# Patient Record
Sex: Female | Born: 1980 | Race: White | Hispanic: No | Marital: Married | Smoking: Never smoker
Health system: Southern US, Community
[De-identification: ages and names within clinical notes are randomized; demographics above are authoritative.]

## PROBLEM LIST (undated history)

## (undated) DIAGNOSIS — K829 Disease of gallbladder, unspecified: Secondary | ICD-10-CM

## (undated) DIAGNOSIS — K649 Unspecified hemorrhoids: Secondary | ICD-10-CM

## (undated) DIAGNOSIS — Z973 Presence of spectacles and contact lenses: Secondary | ICD-10-CM

## (undated) DIAGNOSIS — E538 Deficiency of other specified B group vitamins: Secondary | ICD-10-CM

## (undated) DIAGNOSIS — E559 Vitamin D deficiency, unspecified: Secondary | ICD-10-CM

## (undated) DIAGNOSIS — E7800 Pure hypercholesterolemia, unspecified: Secondary | ICD-10-CM

## (undated) DIAGNOSIS — K644 Residual hemorrhoidal skin tags: Secondary | ICD-10-CM

## (undated) DIAGNOSIS — F419 Anxiety disorder, unspecified: Secondary | ICD-10-CM

## (undated) DIAGNOSIS — Z862 Personal history of diseases of the blood and blood-forming organs and certain disorders involving the immune mechanism: Secondary | ICD-10-CM

## (undated) HISTORY — DX: Deficiency of other specified B group vitamins: E53.8

## (undated) HISTORY — DX: Disease of gallbladder, unspecified: K82.9

## (undated) HISTORY — DX: Anxiety disorder, unspecified: F41.9

## (undated) HISTORY — DX: Vitamin D deficiency, unspecified: E55.9

## (undated) HISTORY — PX: WISDOM TOOTH EXTRACTION: SHX21

## (undated) HISTORY — DX: Pure hypercholesterolemia, unspecified: E78.00

---

## 2018-09-01 HISTORY — PX: COLONOSCOPY: SHX174

## 2019-12-21 ENCOUNTER — Other Ambulatory Visit: Payer: Self-pay | Admitting: Obstetrics & Gynecology

## 2019-12-21 DIAGNOSIS — Z9189 Other specified personal risk factors, not elsewhere classified: Secondary | ICD-10-CM

## 2020-05-11 ENCOUNTER — Ambulatory Visit (HOSPITAL_COMMUNITY)
Admission: EM | Admit: 2020-05-11 | Discharge: 2020-05-11 | Disposition: A | Discharge status: Home / Self Care | Payer: Managed Care, Other (non HMO) | Attending: Urgent Care | Admitting: Urgent Care

## 2020-05-11 ENCOUNTER — Other Ambulatory Visit: Payer: Self-pay

## 2020-05-11 ENCOUNTER — Encounter (HOSPITAL_COMMUNITY): Payer: Self-pay

## 2020-05-11 DIAGNOSIS — L299 Pruritus, unspecified: Secondary | ICD-10-CM | POA: Diagnosis present

## 2020-05-11 DIAGNOSIS — L709 Acne, unspecified: Secondary | ICD-10-CM | POA: Insufficient documentation

## 2020-05-11 DIAGNOSIS — L509 Urticaria, unspecified: Secondary | ICD-10-CM | POA: Diagnosis present

## 2020-05-11 LAB — CBC WITH DIFFERENTIAL/PLATELET
Abs Immature Granulocytes: 0.01 10*3/uL (ref 0.00–0.07)
Basophils Absolute: 0 10*3/uL (ref 0.0–0.1)
Basophils Relative: 1 %
Eosinophils Absolute: 0.1 10*3/uL (ref 0.0–0.5)
Eosinophils Relative: 3 %
HCT: 40.3 % (ref 36.0–46.0)
Hemoglobin: 13.1 g/dL (ref 12.0–15.0)
Immature Granulocytes: 0 %
Lymphocytes Relative: 25 %
Lymphs Abs: 0.9 10*3/uL (ref 0.7–4.0)
MCH: 29.2 pg (ref 26.0–34.0)
MCHC: 32.5 g/dL (ref 30.0–36.0)
MCV: 89.8 fL (ref 80.0–100.0)
Monocytes Absolute: 0.3 10*3/uL (ref 0.1–1.0)
Monocytes Relative: 8 %
Neutro Abs: 2.4 10*3/uL (ref 1.7–7.7)
Neutrophils Relative %: 63 %
Platelets: 334 10*3/uL (ref 150–400)
RBC: 4.49 MIL/uL (ref 3.87–5.11)
RDW: 13.8 % (ref 11.5–15.5)
WBC: 3.7 10*3/uL — ABNORMAL LOW (ref 4.0–10.5)
nRBC: 0 % (ref 0.0–0.2)

## 2020-05-11 LAB — COMPREHENSIVE METABOLIC PANEL
ALT: 547 U/L — ABNORMAL HIGH (ref 0–44)
AST: 193 U/L — ABNORMAL HIGH (ref 15–41)
Albumin: 3.7 g/dL (ref 3.5–5.0)
Alkaline Phosphatase: 170 U/L — ABNORMAL HIGH (ref 38–126)
Anion gap: 12 (ref 5–15)
BUN: 9 mg/dL (ref 6–20)
CO2: 21 mmol/L — ABNORMAL LOW (ref 22–32)
Calcium: 9.6 mg/dL (ref 8.9–10.3)
Chloride: 104 mmol/L (ref 98–111)
Creatinine, Ser: 0.81 mg/dL (ref 0.44–1.00)
GFR calc Af Amer: >60 mL/min (ref >60)
GFR calc non Af Amer: >60 mL/min (ref >60)
Glucose, Bld: 109 mg/dL — ABNORMAL HIGH (ref 70–99)
Potassium: 4.5 mmol/L (ref 3.5–5.1)
Sodium: 137 mmol/L (ref 135–145)
Total Bilirubin: 3.1 mg/dL — ABNORMAL HIGH (ref 0.3–1.2)
Total Protein: 7.5 g/dL (ref 6.5–8.1)

## 2020-05-11 MED ORDER — HYDROXYZINE HCL 25 MG PO TABS: 12.5000 mg | ORAL_TABLET | Freq: Three times a day (TID) | ORAL | 0 refills | Status: DC | PRN

## 2020-05-11 NOTE — ED Triage Notes (Addendum)
Patient presents to Urgent Care with complaints of generalized itching and bright yellow urine since 4 days ago, worse yesterday. Patient reports she has had her gallbladder removed and thinks there might be a connection.  Pt took an antihistamine last night (generic claritin) but no improvement. Pt received call from hematology during triage- blood work was completed yesterday and results showed elevated ALT (464) and AST (176), also Bilirubin was 1.8. Hematologist suspicious of liver abnormalities, no CBC done yesterday, suggesting recheck of blood work to include CBC and full liver panel.

## 2020-05-11 NOTE — ED Provider Notes (Signed)
[facility]   MRN: [id number] DOB: 02/27/1981  Subjective:   Breanna Dunn is a 39 y.o. female presenting for 4 day hx generalized itching, erythema patches over different places.  Patient states that she was seen by her dermatologist recently and was about to start Accutane when they drew labs as a screen.  Turned out she had elevated LFTs and a bilirubin of 1.8.  Patient's mother has significant history of malignancy.  She has actually undergone multiple screening tests including a colonoscopy and everything is come back negative up to now.  Denies fever, headache, sore throat, cough, chest pain, shortness of breath, vomiting, belly pain, dysuria, hematuria, clay colored stools.  Patient does not handle blood-borne products, is not an IV drug user, does not drink alcohol.  She is not a smoker.  Denies personal history of cancer. Denies eating any new foods, starting new medications, exposure to poisonous plants, new hygiene products, new cleaning products or detergents.   No current facility-administered medications for this encounter.  Current Outpatient Medications:  .  isotretinoin (ACCUTANE) 10 MG capsule, Take 10 mg by mouth 2 (two) times daily., Disp: , Rfl:    No Known Allergies  History reviewed. No pertinent past medical history.   History reviewed. No pertinent surgical history.  Family History  Problem Relation Age of Onset  . Cancer Mother        ovarian  . Multiple sclerosis Mother   . Healthy Father     Social History   Tobacco Use  . Smoking status: Never Smoker  . Smokeless tobacco: Never Used  Vaping Use  . Vaping Use: Never used  Substance Use Topics  . Alcohol use: Yes    Comment: weekly/ socially  . Drug use: Never    ROS   Objective:   Vitals: BP (!) 150/94 (BP Location: Right Arm)   Pulse 89   Temp 98.1 F (36.7 C) (Oral)   Resp 16   SpO2 100%   Physical Exam Constitutional:      General: She is not in acute  distress.    Appearance: Normal appearance. She is well-developed. She is not ill-appearing, toxic-appearing or diaphoretic.  HENT:     Head: Normocephalic and atraumatic.     Right Ear: External ear normal.     Left Ear: External ear normal.     Nose: Nose normal.     Mouth/Throat:     Mouth: Mucous membranes are moist.     Pharynx: Oropharynx is clear. No oropharyngeal exudate or posterior oropharyngeal erythema.  Eyes:     General: No scleral icterus.       Right eye: No discharge.        Left eye: No discharge.     Extraocular Movements: Extraocular movements intact.     Conjunctiva/sclera: Conjunctivae normal.     Pupils: Pupils are equal, round, and reactive to light.  Cardiovascular:     Rate and Rhythm: Normal rate and regular rhythm.     Pulses: Normal pulses.     Heart sounds: Normal heart sounds. No murmur heard.  No friction rub. No gallop.   Pulmonary:     Effort: Pulmonary effort is normal. No respiratory distress.     Breath sounds: Normal breath sounds. No stridor. No wheezing, rhonchi or rales.  Skin:    General: Skin is warm and dry.     Findings: Rash (urticarial lesions over back, chest; erythematous patches over patches) present.  Neurological:  Mental Status: She is alert and oriented to person, place, and time.  Psychiatric:        Mood and Affect: Mood normal.        Behavior: Behavior normal.        Thought Content: Thought content normal.        Judgment: Judgment normal.     Assessment and Plan :   PDMP not reviewed this encounter.  1. Itching   2. Urticaria   3. Acne, unspecified acne type    Labs pending, recommended hydroxyzine for itching and urticaria.  Also emphasized that she needs to keep a diary of any new foods or medicines that she is exposing herself to.  Recommend establishing care with new PCP, information provided to the patient. Counseled patient on potential for adverse effects with medications prescribed/recommended today,  ER and return-to-clinic precautions discussed, patient verbalized understanding.    Wallis Bamberg, PA-C 05/11/20 1740

## 2020-05-21 ENCOUNTER — Other Ambulatory Visit: Payer: Self-pay | Admitting: Gastroenterology

## 2020-05-21 DIAGNOSIS — R7989 Other specified abnormal findings of blood chemistry: Secondary | ICD-10-CM

## 2020-05-28 ENCOUNTER — Other Ambulatory Visit: Payer: Managed Care, Other (non HMO)

## 2020-06-07 ENCOUNTER — Ambulatory Visit
Admission: RE | Admit: 2020-06-07 | Discharge: 2020-06-07 | Disposition: A | Discharge status: Home / Self Care | Payer: Managed Care, Other (non HMO) | Source: Ambulatory Visit | Attending: Gastroenterology | Admitting: Gastroenterology

## 2020-06-07 DIAGNOSIS — R7989 Other specified abnormal findings of blood chemistry: Secondary | ICD-10-CM

## 2020-06-07 IMAGING — US US ABDOMEN LIMITED
1 series · 14 of 25 positions shown · non-contrast
Comparison: None

CLINICAL DATA: Elevated LFTs

EXAM:
ULTRASOUND ABDOMEN LIMITED RIGHT UPPER QUADRANT

[Series 1: us abdomen limited · 0.23mm/px · 14 of 36 slices shown]
[im 1/36]
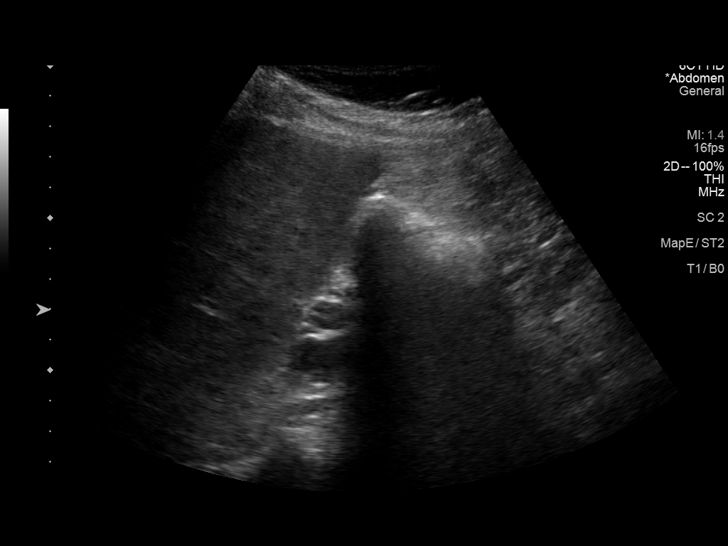
[im 3/36]
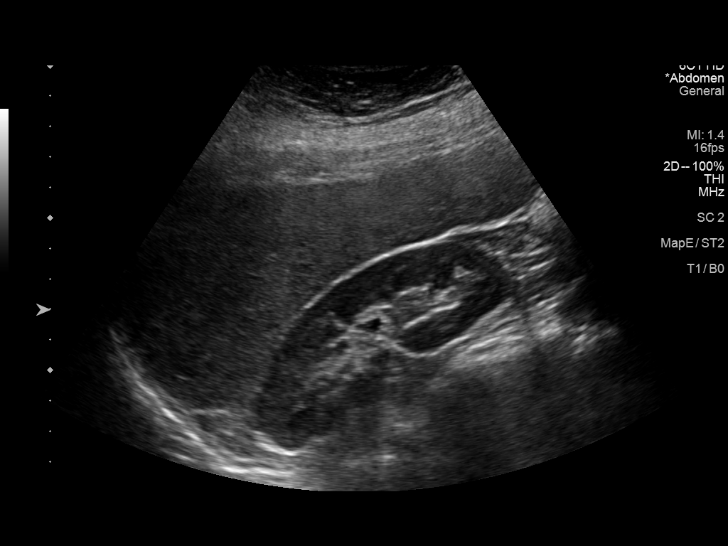
[im 6/36]
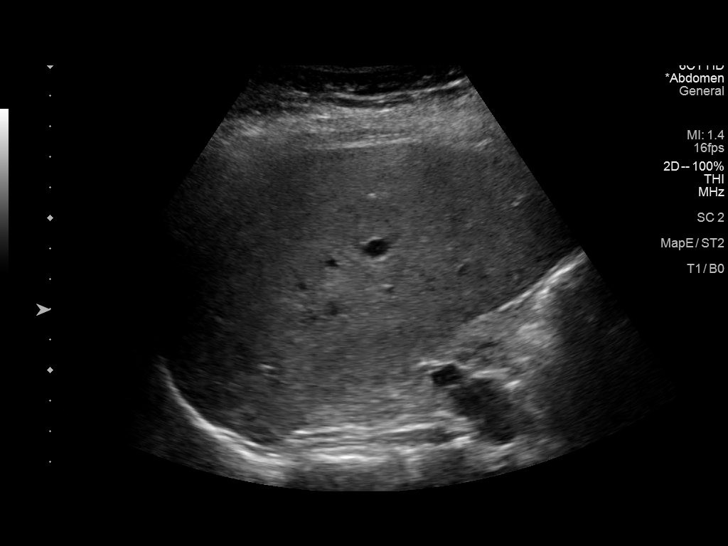
[im 9/36]
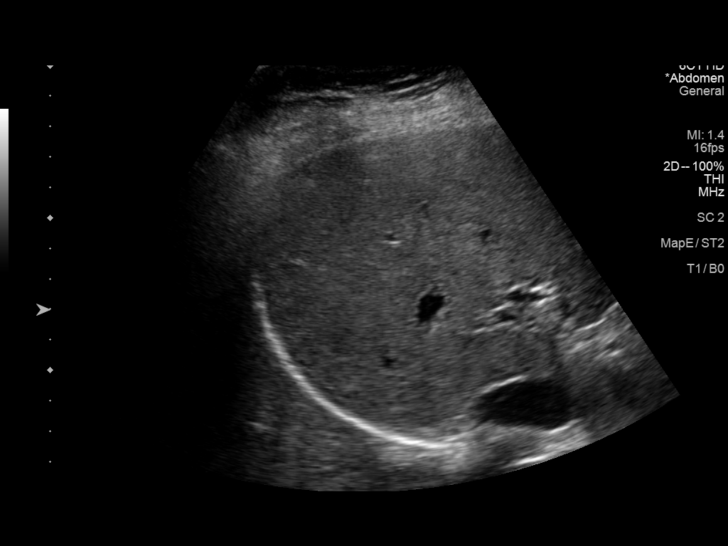
[im 12/36]
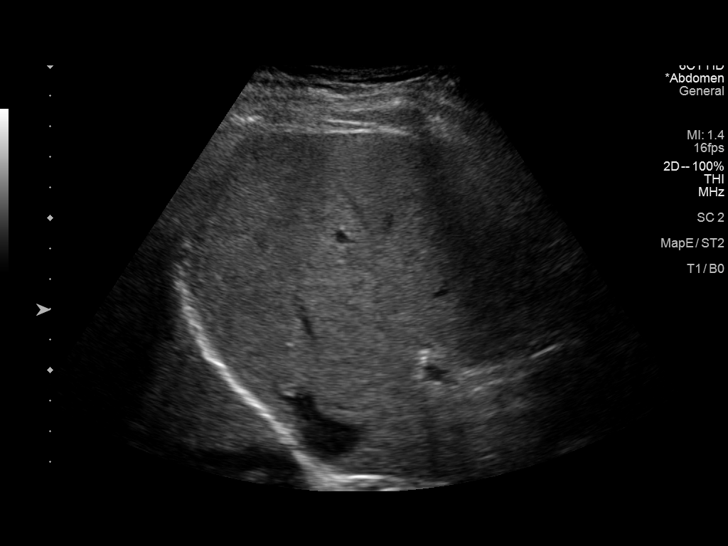
[im 14/36]
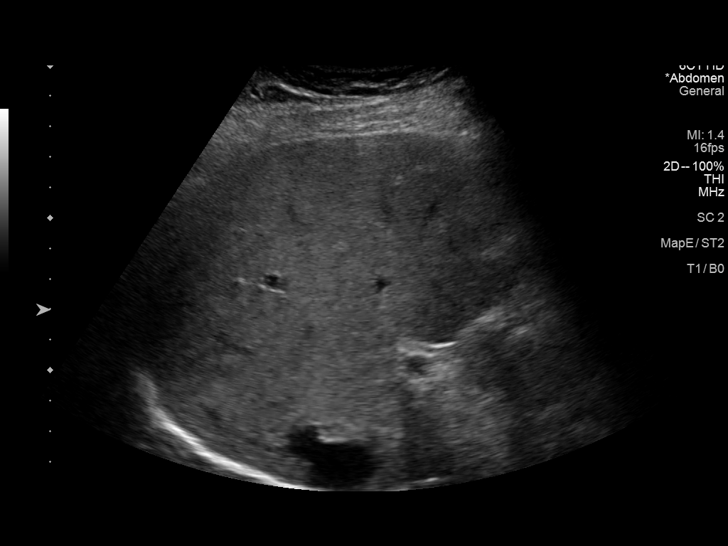
[im 17/36]
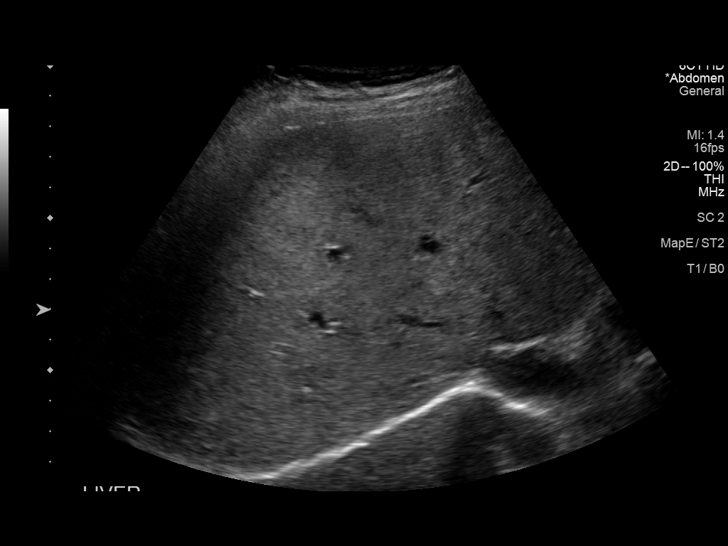
[im 19/36]
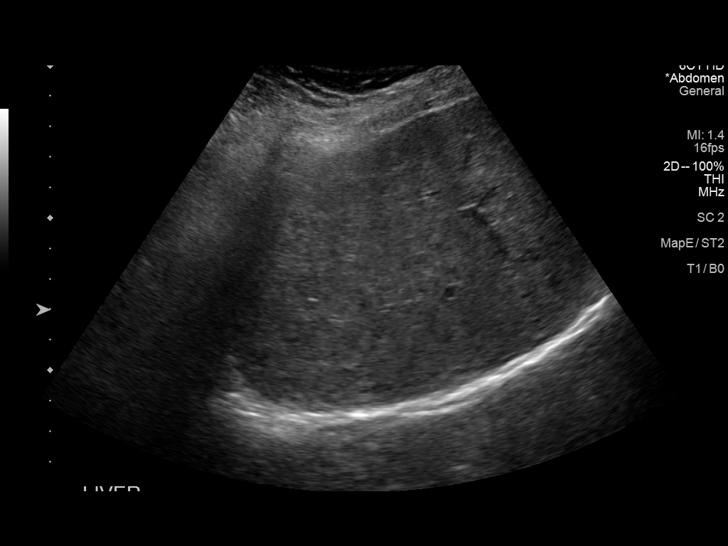
[im 22/36]
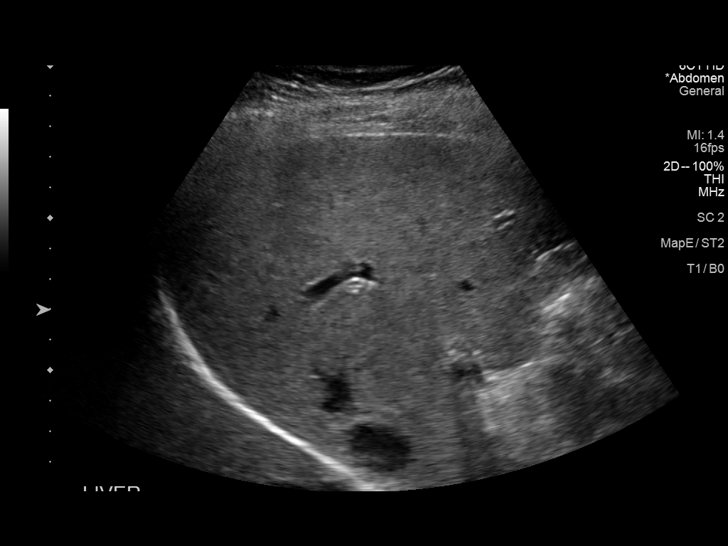
[im 24/36]
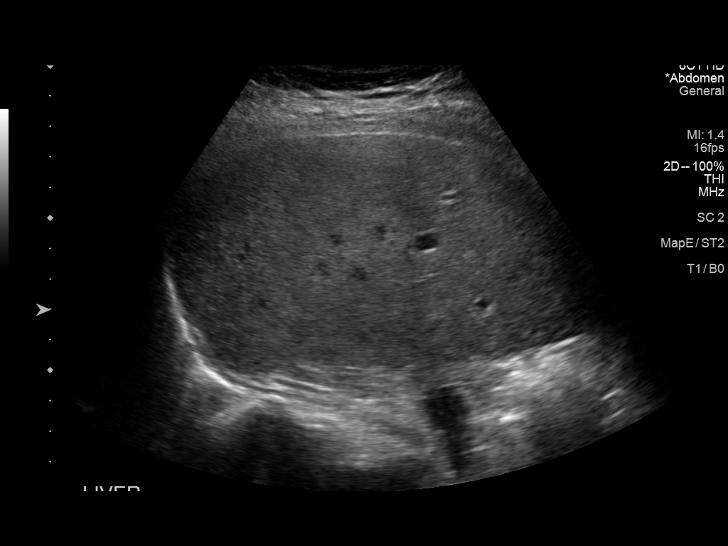
[im 27/36]
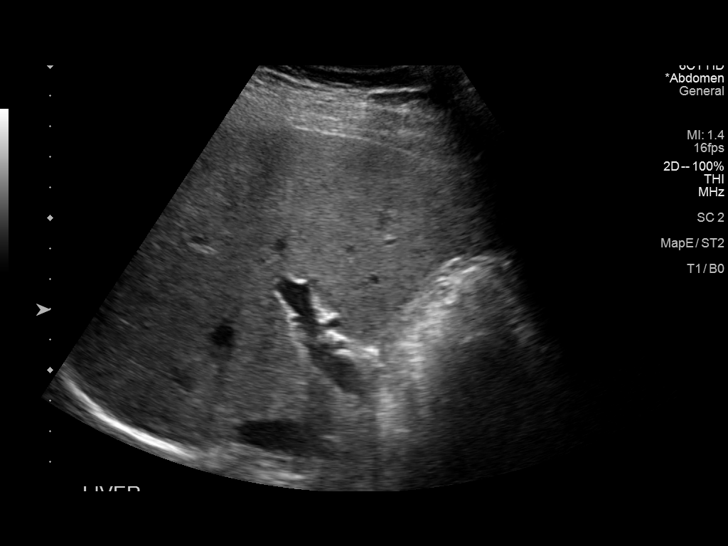
[im 30/36]
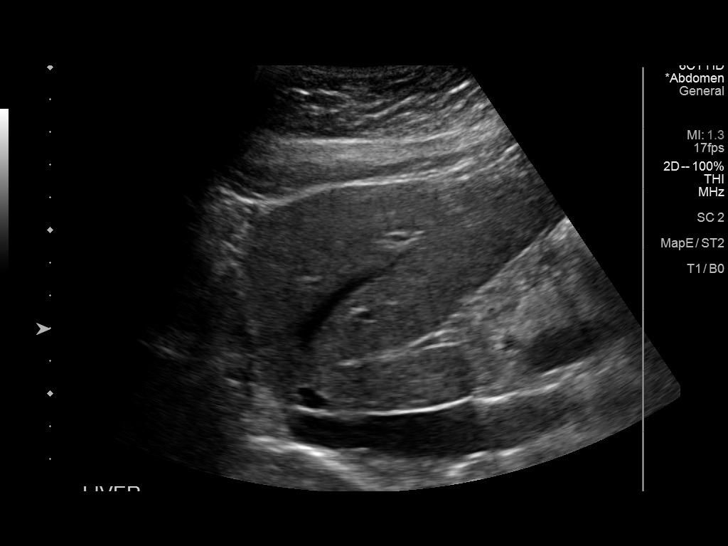
[im 33/36]
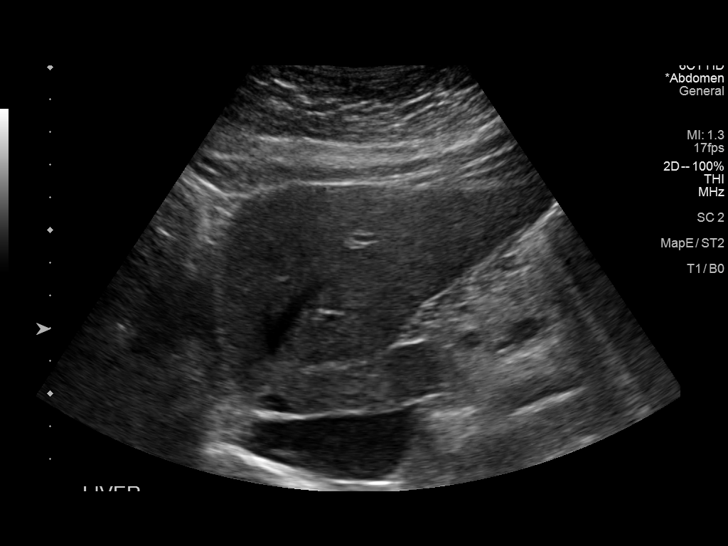
[im 36/36]
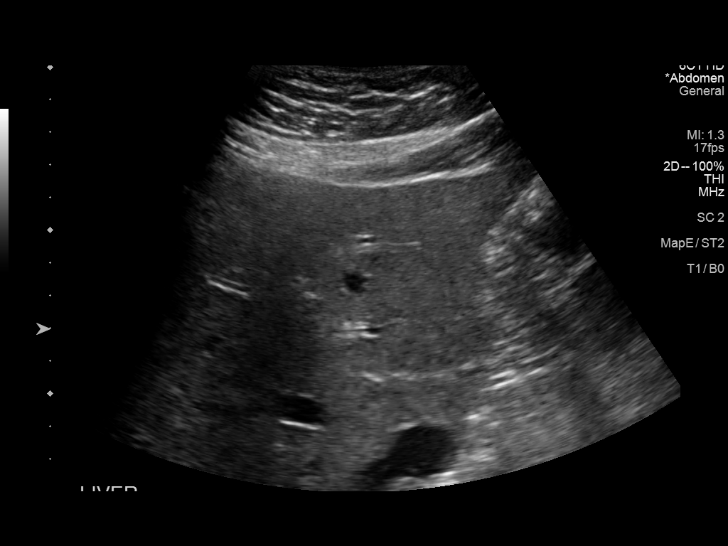

[14 of 25 positions shown; findings below may reference images not displayed]

FINDINGS: Gallbladder:

Surgically absent

Common bile duct:

Diameter: 4 mm, normal

Liver:

Normal hepatic echogenicity. No mass or nodularity. No intrahepatic
biliary dilatation. Portal vein is patent on color Doppler imaging
with normal direction of blood flow towards the liver.

Other: No RIGHT upper quadrant free fluid.
IMPRESSION: Post cholecystectomy.

Otherwise normal exam.

## 2020-07-01 ENCOUNTER — Other Ambulatory Visit: Payer: Managed Care, Other (non HMO)

## 2020-09-01 DIAGNOSIS — Z8616 Personal history of COVID-19: Secondary | ICD-10-CM

## 2020-09-01 HISTORY — DX: Personal history of COVID-19: Z86.16

## 2021-02-05 ENCOUNTER — Other Ambulatory Visit: Payer: Self-pay | Admitting: Obstetrics & Gynecology

## 2021-02-05 DIAGNOSIS — R922 Inconclusive mammogram: Secondary | ICD-10-CM

## 2021-02-14 ENCOUNTER — Other Ambulatory Visit (HOSPITAL_COMMUNITY): Payer: Self-pay | Admitting: Obstetrics & Gynecology

## 2021-02-14 DIAGNOSIS — Z9189 Other specified personal risk factors, not elsewhere classified: Secondary | ICD-10-CM

## 2021-09-10 ENCOUNTER — Other Ambulatory Visit: Payer: Self-pay

## 2021-09-10 ENCOUNTER — Ambulatory Visit
Admission: RE | Admit: 2021-09-10 | Discharge: 2021-09-10 | Disposition: A | Discharge status: Home / Self Care | Payer: Managed Care, Other (non HMO) | Source: Ambulatory Visit | Attending: Obstetrics & Gynecology | Admitting: Obstetrics & Gynecology

## 2021-09-10 DIAGNOSIS — R9230 Dense breasts, unspecified: Secondary | ICD-10-CM

## 2021-09-10 DIAGNOSIS — R922 Inconclusive mammogram: Secondary | ICD-10-CM

## 2021-09-10 IMAGING — MR MR BREAST BILAT WO/W CM
8 of 12 series · 32 of 48 positions shown · IV contrast (10 GADAVIST)
Comparison: None.

CLINICAL DATA: Inconclusive mammography, dense breast tissue.
Family history of breast cancer in the patient's grandmother. No
current complaints.

EXAM:
BILATERAL BREAST MRI WITH AND WITHOUT CONTRAST
TECHNIQUE: Multiplanar, multisequence MR images of both breasts were obtained
prior to and following the intravenous administration of 10 ml of
Gadavist

[Series 2: t2_tirm_tra ipat (a-p) · axial · 3.0mm · 0.78mm/px · 1 of 54 slices shown]
[im 1/54]
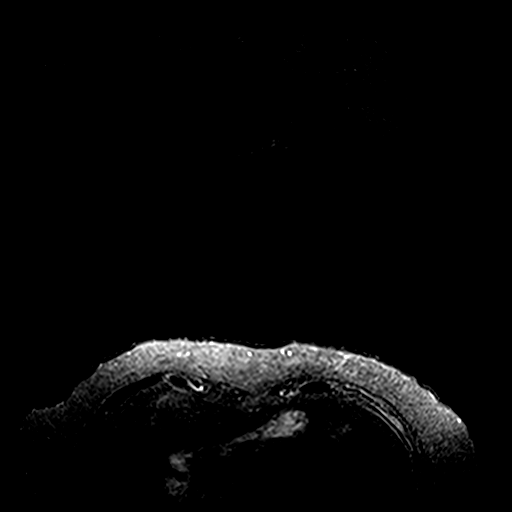

[Series 3: fl3d pre-cm no · axial · non-contrast · 1.2mm · 1.04mm/px · z∈[-76,+95]mm · 5 of 144 slices shown]
[im 1/144]
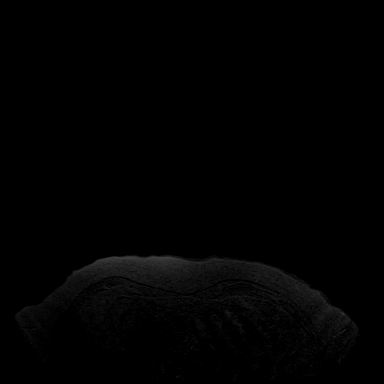
[im 36/144]
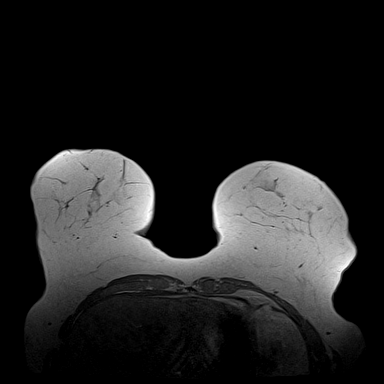
[im 72/144]
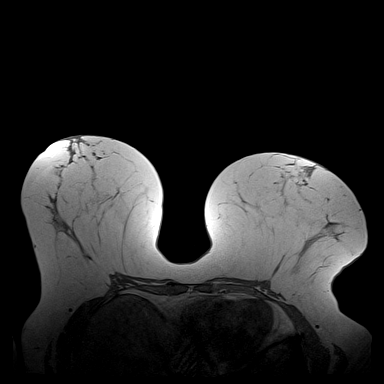
[im 108/144]
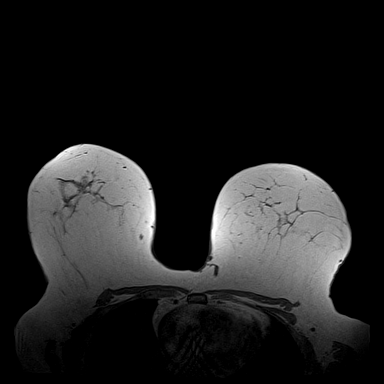
[im 144/144]
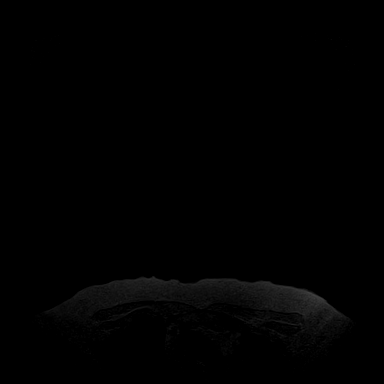

[Series 4: fl3d pre-cm · axial · non-contrast · 1.2mm · 1.04mm/px · z∈[-76,+95]mm · 5 of 144 slices shown]
[im 1/144]
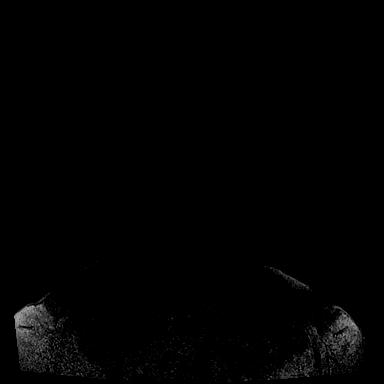
[im 36/144]
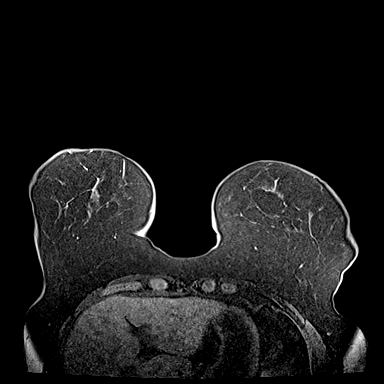
[im 72/144]
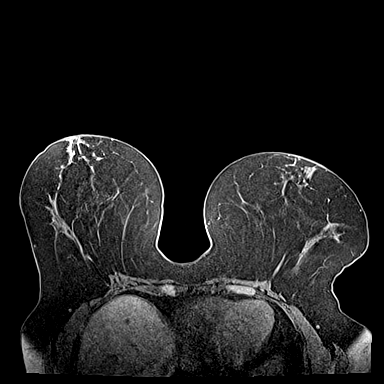
[im 108/144]
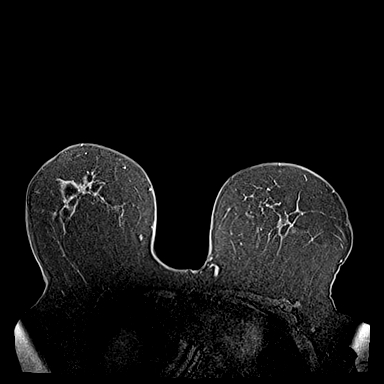
[im 144/144]
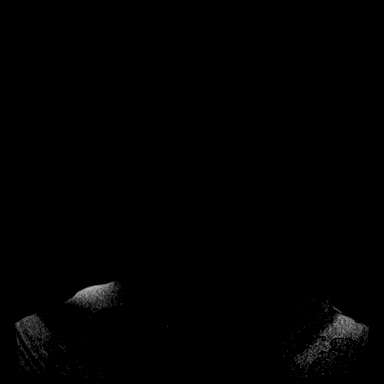

[Series 5: fl3d post-cm 20 · axial · 1.2mm · 1.04mm/px · z∈[-76,+95]mm · 5 of 144 slices shown (1 of 3)]
[im 1/144]
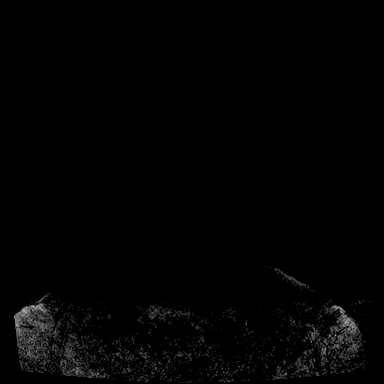
[im 36/144]
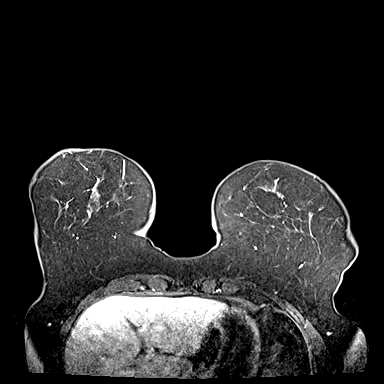
[im 72/144]
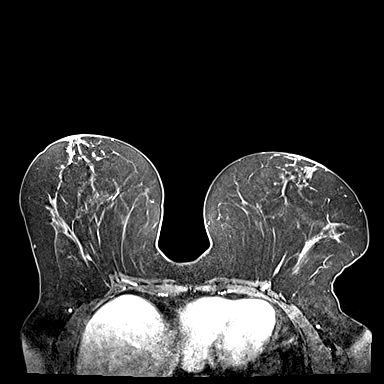
[im 108/144]
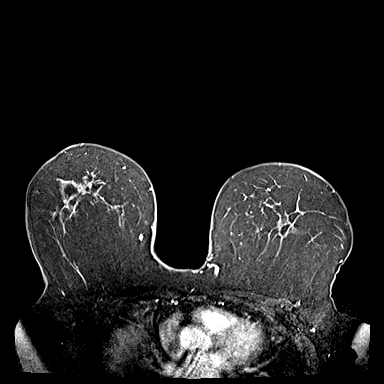
[im 144/144]
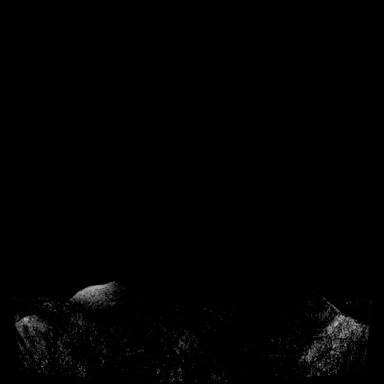

[Series 6: fl3d post-cm 20 · axial · 1.2mm · 1.04mm/px · z∈[-76,+95]mm · 5 of 144 slices shown (2 of 3)]
[im 1/144]
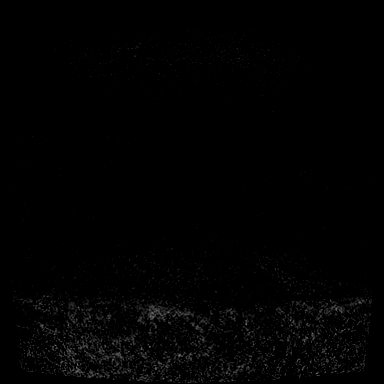
[im 36/144]
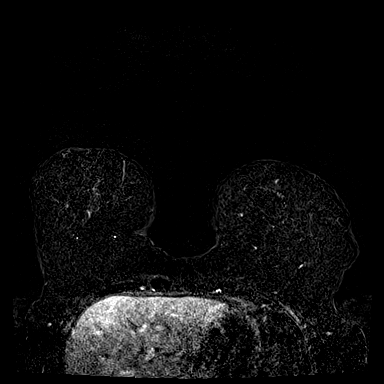
[im 72/144]
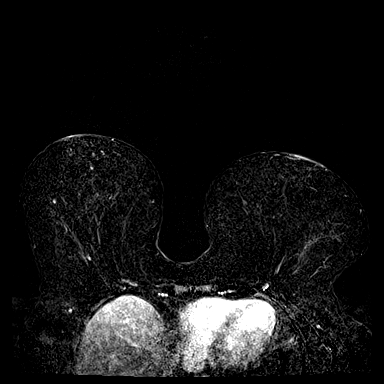
[im 108/144]
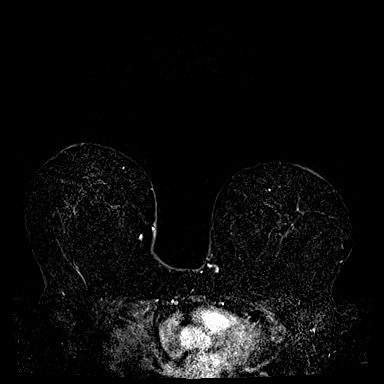
[im 144/144]
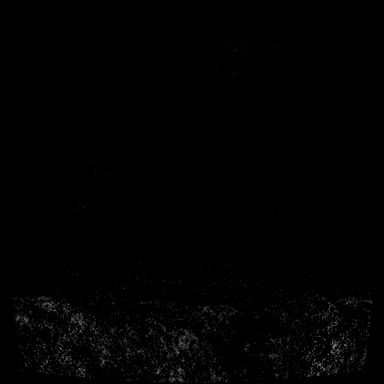

[Series 7: fl3d post-cm 20 · axial · 172.8mm · 1.04mm/px · 1 of 1 slices shown (3 of 3)]
[im 1/1]
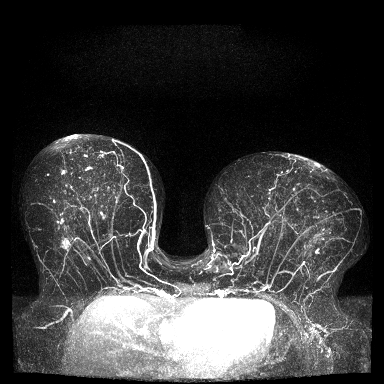

[Series 8: fl3d post-cm 3 · axial · 1.2mm · 1.04mm/px · z∈[-76,+95]mm · 6 of 144 slices shown (1 of 2)]
[im 1/144]
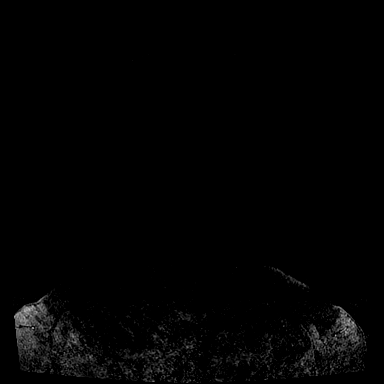
[im 29/144]
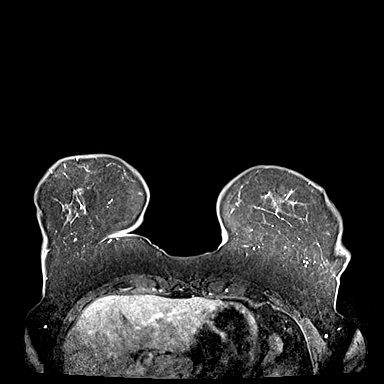
[im 58/144]
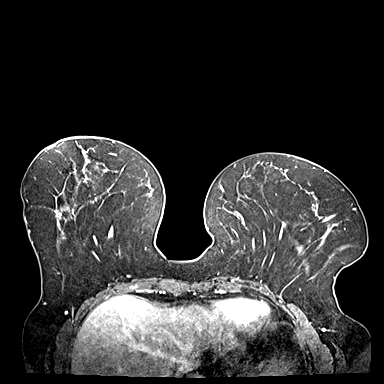
[im 86/144]
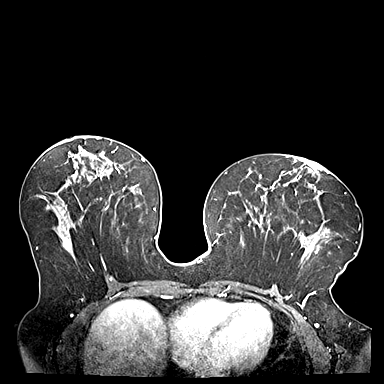
[im 115/144]
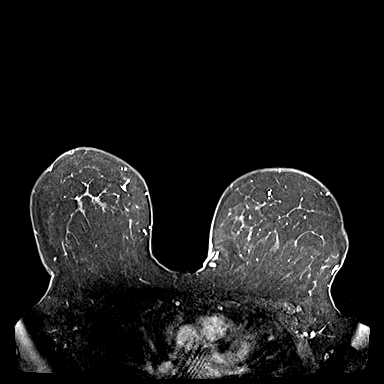
[im 144/144]
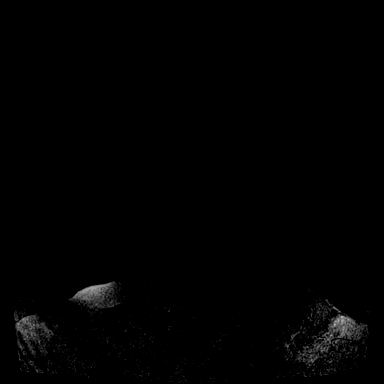

[Series 9: fl3d post-cm 3 · axial · 1.2mm · 1.04mm/px · z∈[-76,+26]mm · 4 of 144 slices shown (2 of 2)]
[im 1/144]
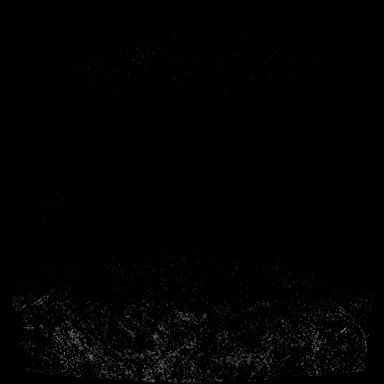
[im 29/144]
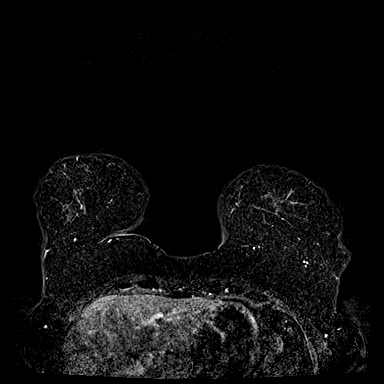
[im 58/144]
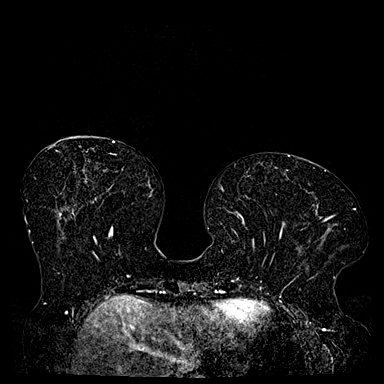
[im 86/144]
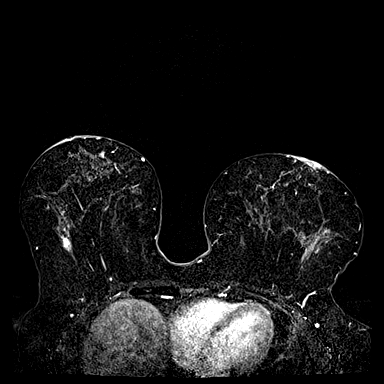

[32 of 48 positions shown; findings below may reference images not displayed]

Three-dimensional MR images were rendered by post-processing of the
original MR data on an independent workstation. The
three-dimensional MR images were interpreted, and findings are
reported in the following complete MRI report for this study. Three
dimensional images were evaluated at the independent interpreting
workstation using the DynaCAD thin client.
FINDINGS: Breast composition: b. Scattered fibroglandular tissue.

Background parenchymal enhancement: Mild

Right breast: There is an enhancing mass in the upper outer right
breast measuring 1.6 x 1.0 x 1.1 cm (series 12, image 56). This
demonstrates benign kinetics. There is another similar appearing
mass just anterior to this in the upper outer quadrant measuring
cm (series 12, image 55). A third similar appearing mass in the
superior central right breast measures 1.0 cm (series 12, image 50).

Left breast: No mass or abnormal enhancement.

Lymph nodes: No abnormal appearing lymph nodes.

Ancillary findings:  None.
IMPRESSION: 1. Three indeterminate similar appearing enhancing masses in the
upper outer and upper central right breast. The largest of these
measures 1.6 cm.
2. No MRI evidence of malignancy in the left breast.

RECOMMENDATION:
Second-look ultrasound and possible biopsy for the 3 enhancing
masses in the right breast. If these cannot be identified
sonographically recommend MRI guided needle biopsy of the largest
mass in the upper outer right breast. If the biopsy is benign, given
similar appearance of the other 2 masses, may recommend six-month
MRI follow-up.

BI-RADS CATEGORY  4: Suspicious.

## 2021-09-10 MED ORDER — GADOBUTROL 1 MMOL/ML IV SOLN
10.0000 mL | Freq: Once | INTRAVENOUS | Status: AC | PRN
  Administered 2021-09-10: 10 mL via INTRAVENOUS

## 2021-09-13 ENCOUNTER — Other Ambulatory Visit: Payer: Self-pay | Admitting: Obstetrics & Gynecology

## 2021-09-13 DIAGNOSIS — R9389 Abnormal findings on diagnostic imaging of other specified body structures: Secondary | ICD-10-CM

## 2021-09-20 ENCOUNTER — Other Ambulatory Visit (HOSPITAL_COMMUNITY): Payer: Self-pay | Admitting: Family Medicine

## 2021-09-23 ENCOUNTER — Other Ambulatory Visit: Payer: Self-pay | Admitting: Obstetrics & Gynecology

## 2021-09-23 DIAGNOSIS — R9389 Abnormal findings on diagnostic imaging of other specified body structures: Secondary | ICD-10-CM

## 2021-09-27 ENCOUNTER — Other Ambulatory Visit: Payer: Self-pay

## 2021-09-27 ENCOUNTER — Ambulatory Visit (HOSPITAL_COMMUNITY)
Admission: RE | Admit: 2021-09-27 | Discharge: 2021-09-27 | Disposition: A | Discharge status: Home / Self Care | Payer: Self-pay | Source: Ambulatory Visit | Attending: Family Medicine | Admitting: Family Medicine

## 2021-09-27 DIAGNOSIS — Z136 Encounter for screening for cardiovascular disorders: Secondary | ICD-10-CM | POA: Insufficient documentation

## 2021-09-27 DIAGNOSIS — E7800 Pure hypercholesterolemia, unspecified: Secondary | ICD-10-CM | POA: Insufficient documentation

## 2021-09-27 IMAGING — CT CT CARDIAC CORONARY ARTERY CALCIUM SCORE
2 series · 15 of 20 positions shown, 17 images · non-contrast
Comparison: None.
COMPARISON: None.

Addendum:
EXAM:
OVER-READ INTERPRETATION  CT CHEST

The following report is an over-read performed by radiologist Dr.
NOMEIKA [REDACTED] on [DATE]. This
over-read does not include interpretation of cardiac or coronary
anatomy or pathology. The coronary calcium score/coronary CTA
interpretation by the cardiologist is attached.
CLINICAL DATA: Cardiovascular Disease Risk stratification
Coronary Calcium Score
TECHNIQUE: A gated, non-contrast computed tomography scan of the heart was
performed using 3mm slice thickness. Axial images were analyzed on a
dedicated workstation. Calcium scoring of the coronary arteries was
performed using the Agatston method.

[Series 3: cascseq 2.0 b35f 70% · axial · 0.39mm/px · z∈[+1156,+1246]mm · 7 of 69 slices shown]
[im 8/69  vessel]
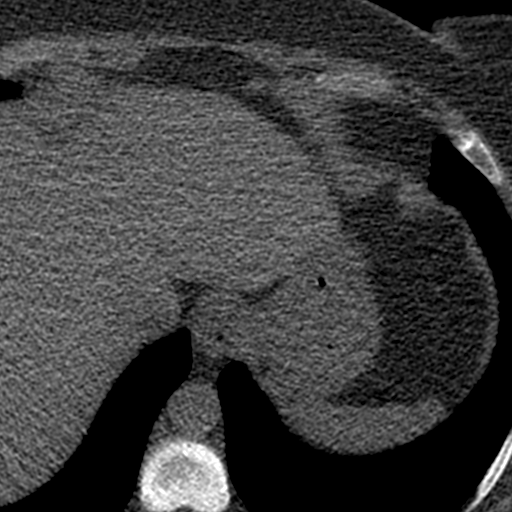
[im 16/69  vessel]
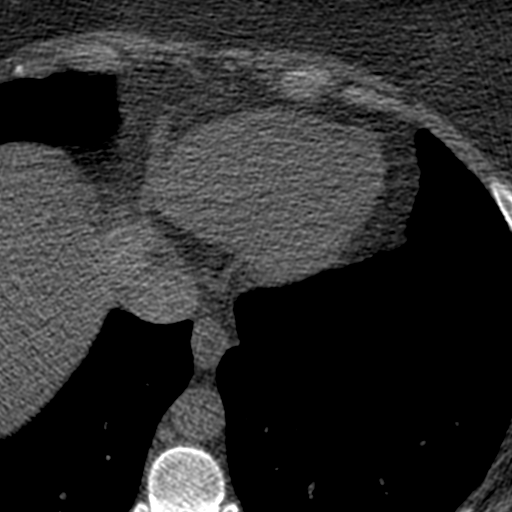
[im 23/69  vessel]
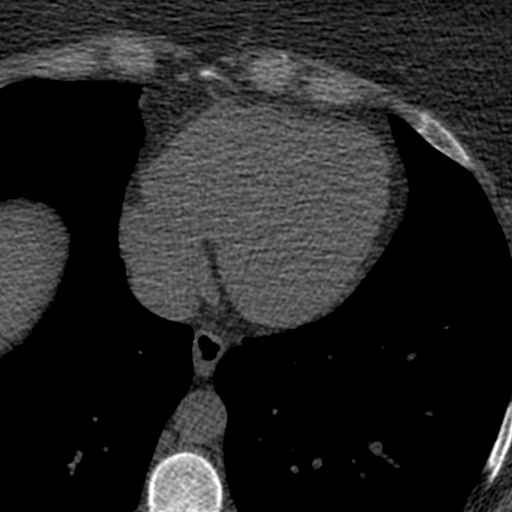
[im 31/69  vessel]
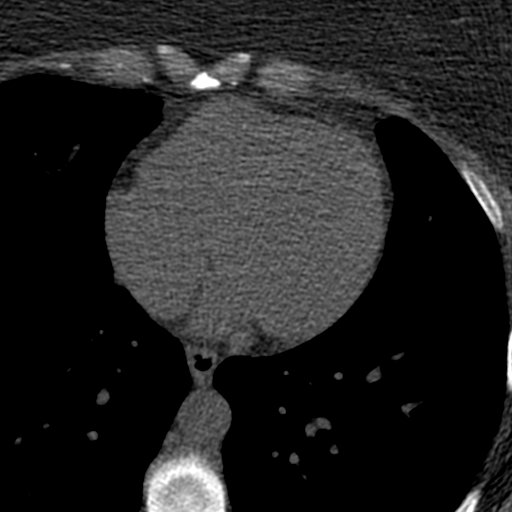
[im 38/69  vessel]
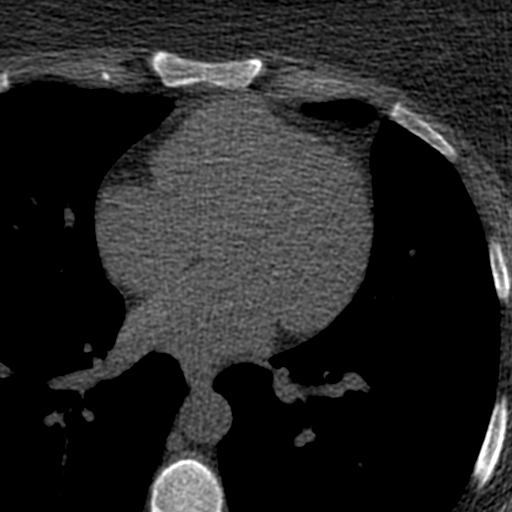
[im 46/69  vessel]
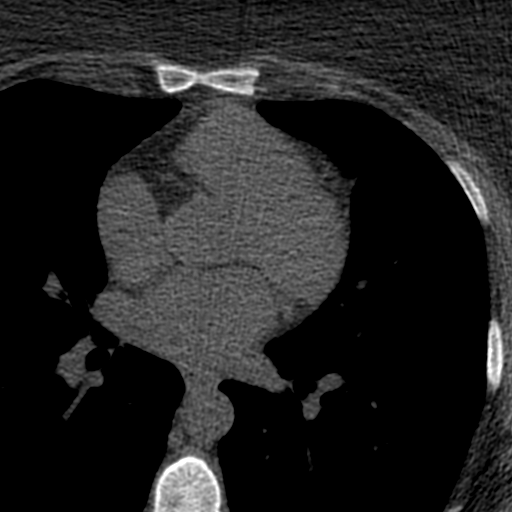
[im 53/69  vessel]
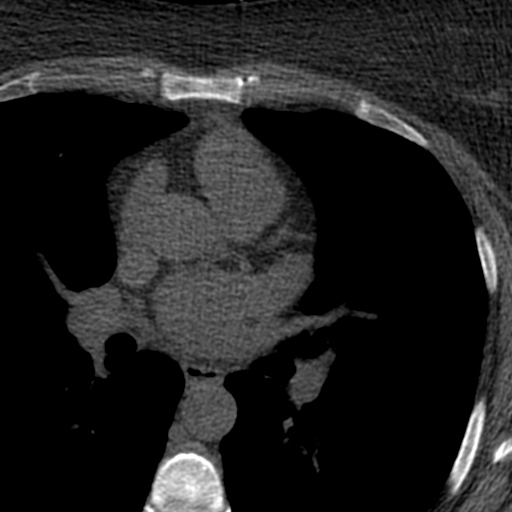

[Series 4: ax st full fov · axial · 0.65mm/px · z∈[+1156,+1262]mm · 8 of 69 slices shown, 10 images]
[im 8/69  vessel]
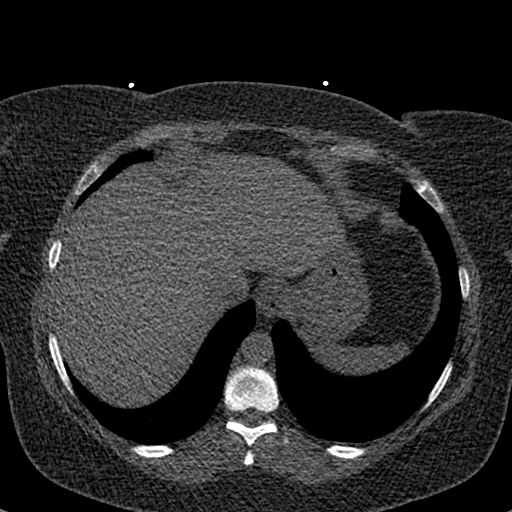
[im 8/69  lung]
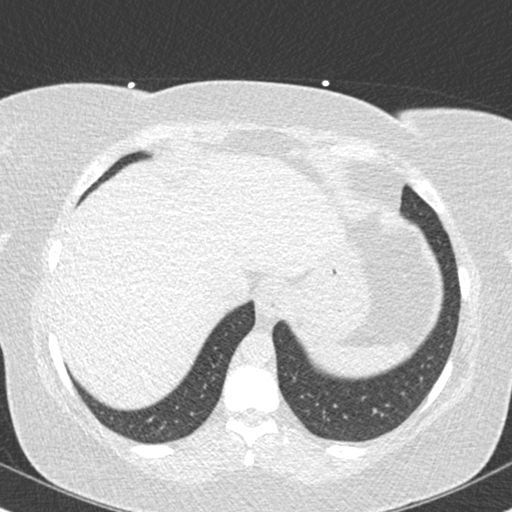
[im 16/69  vessel]
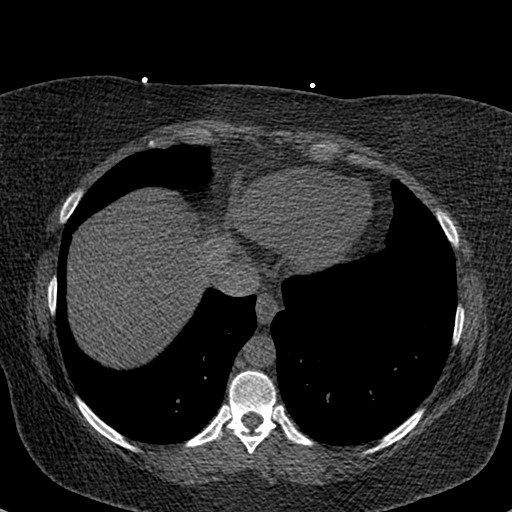
[im 23/69  vessel]
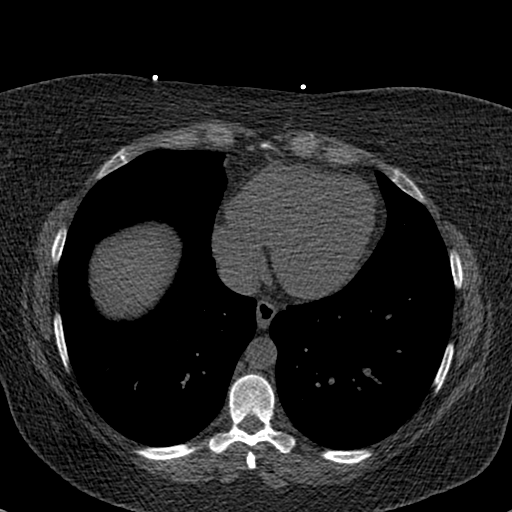
[im 31/69  vessel]
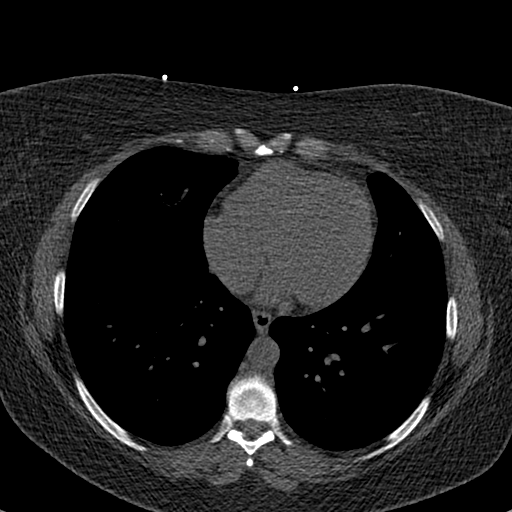
[im 38/69  vessel]
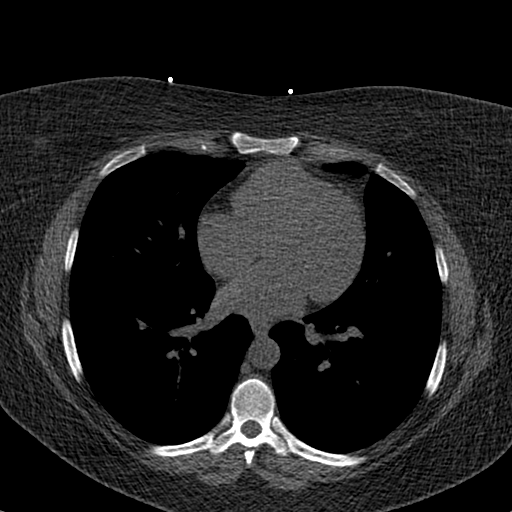
[im 38/69  lung]
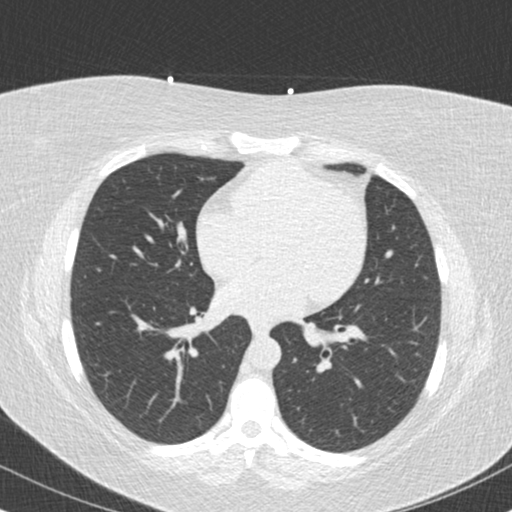
[im 46/69  vessel]
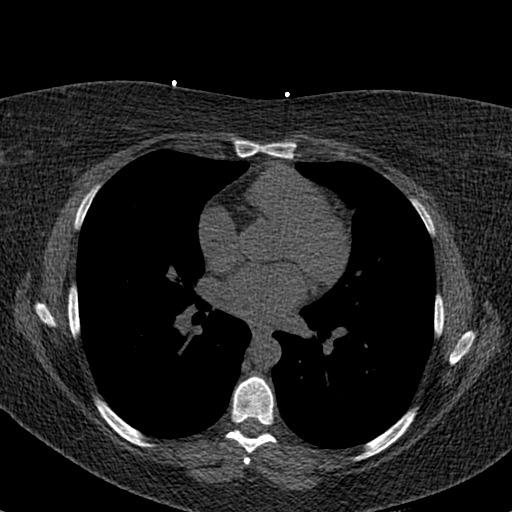
[im 53/69  vessel]
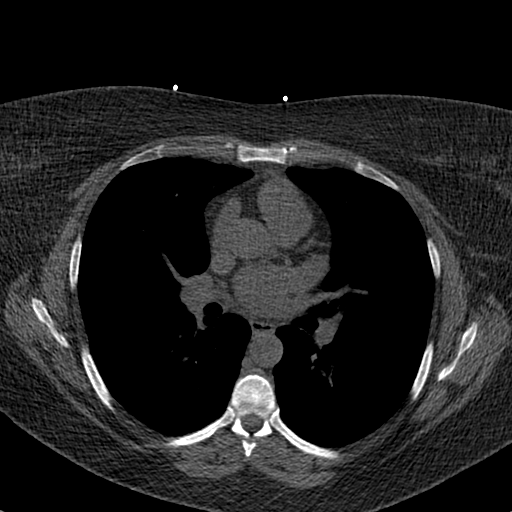
[im 61/69  vessel]
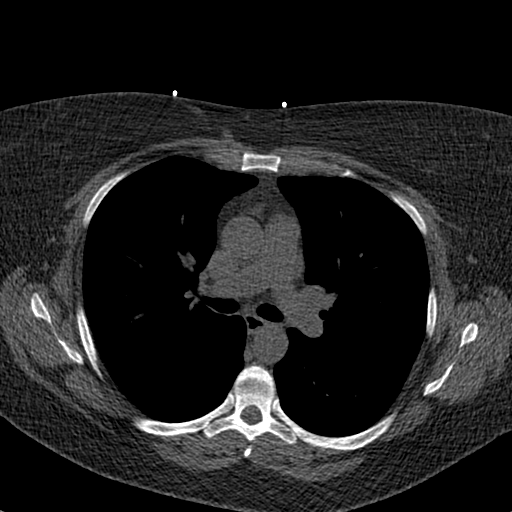

[15 of 20 positions shown; findings below may reference images not displayed]

FINDINGS: Within the visualized portions of the thorax there are no suspicious
appearing pulmonary nodules or masses, there is no acute
consolidative airspace disease, no pleural effusions, no
pneumothorax and no lymphadenopathy. Visualized portions of the
upper abdomen are unremarkable. There are no aggressive appearing
lytic or blastic lesions noted in the visualized portions of the
skeleton.
IMPRESSION: 1. No significant incidental noncardiac findings are noted.
FINDINGS: Coronary arteries: Normal origins.

Coronary Calcium Score:

Left main: 0

Left anterior descending artery: 0

Left circumflex artery: 0

Right coronary artery: 0

Total: 0

Percentile: 0

Pericardium: Normal.

Aorta: Normal caliber of ascending aorta. No aortic atherosclerosis
noted.

Non-cardiac: See separate report from [REDACTED].
IMPRESSION: Coronary calcium score of 0. This was 0 percentile for age-, race-,
and sex-matched controls.



If CAC=0, it is reasonable to withhold statin therapy and reassess
in 5 to 10 years, as long as higher risk conditions are absent
(diabetes mellitus, family history of premature CHD in first degree
relatives (males <55 years; females <65 years), cigarette smoking,
or LDL >=190 mg/dL).

If CAC is 1 to 99, it is reasonable to initiate statin therapy for
patients >=55 years of age.

If CAC is >=100 or >=75th percentile, it is reasonable to initiate
statin therapy at any age.

Cardiology referral should be considered for patients with CAC
scores >=400 or >=75th percentile.

*[MH] AHA/ACC/AACVPR/AAPA/ABC/NOMEIKA/NOMEIKA/NOMEIKA/NOMEIKA/NOMEIKA/NOMEIKA/NOMEIKA
Guideline on the Management of Blood Cholesterol: A Report of the
American College of Cardiology/American Heart Association Task Force
on Clinical Practice Guidelines. J Am Coll Cardiol.
[MH];73(24):[PHONE_NUMBER].

*** End of Addendum ***
EXAM:
OVER-READ INTERPRETATION  CT CHEST

The following report is an over-read performed by radiologist Dr.
NOMEIKA [REDACTED] on [DATE]. This
over-read does not include interpretation of cardiac or coronary
anatomy or pathology. The coronary calcium score/coronary CTA
interpretation by the cardiologist is attached.
FINDINGS: Within the visualized portions of the thorax there are no suspicious
appearing pulmonary nodules or masses, there is no acute
consolidative airspace disease, no pleural effusions, no
pneumothorax and no lymphadenopathy. Visualized portions of the
upper abdomen are unremarkable. There are no aggressive appearing
lytic or blastic lesions noted in the visualized portions of the
skeleton.
IMPRESSION: 1. No significant incidental noncardiac findings are noted.

## 2021-09-30 ENCOUNTER — Ambulatory Visit: Payer: Self-pay | Admitting: General Surgery

## 2021-09-30 NOTE — H&P (Signed)
REFERRING PHYSICIAN:  Charna Elizabeth, MD   PROVIDER:  Elenora Gamma, MD   MRN: [id number] DOB: 08-Jul-1981 DATE OF ENCOUNTER: 09/30/2021   Subjective    Chief Complaint: Hemorrhoids       History of Present Illness: Breanna Dunn is a 41 y.o. female who is seen today as an office consultation at the request of Dr. Elnoria Howard for evaluation of Hemorrhoids   41 year old female who presents to the office for evaluation of anal discomfort and a hemorrhoid.  She states that she has had this for approximately 5 years.  It became very difficult to clean and can become very inflamed at times.  She states that she has loose to watery stools on a more consistent basis and denies any constipation.  This area becomes more inflamed when she has more bowel movements.  At baseline she has approximately 2/day.  She occasionally has bleeding as well       Review of Systems: A complete review of systems was obtained from the patient.  I have reviewed this information and discussed as appropriate with the patient.  See HPI as well for other ROS.       Medical History: Past Medical History  History reviewed. No pertinent past medical history.     There is no problem list on file for this patient.     Past Surgical History       Past Surgical History:  Procedure Laterality Date   LAPAROSCOPIC CHOLECYSTECTOMY            Allergies      Allergies  Allergen Reactions   Sulfamethoxazole-Trimethoprim Other (See Comments)              Current Outpatient Medications on File Prior to Visit  Medication Sig Dispense Refill   drospirenone-ethinyl estradioL (YAZ) 3-0.02 mg tablet Take 1 tablet by mouth once daily       folic acid (FOLVITE) 1 MG tablet          No current facility-administered medications on file prior to visit.      Family History       Family History  Problem Relation Age of Onset   Colon cancer Mother     Stroke Mother          Social History        Tobacco Use  Smoking Status Never  Smokeless Tobacco Never      Social History  Social History        Socioeconomic History   Marital status: Married  Tobacco Use   Smoking status: Never   Smokeless tobacco: Never  Substance and Sexual Activity   Alcohol use: Yes   Drug use: Never        Objective:         Vitals:    09/30/21 1133  BP: 138/80  Pulse: (!) 115  Temp: 37.2 C (98.9 F)  SpO2: 96%  Weight: 99.1 kg (218 lb 6.4 oz)  Height: 154.9 cm (5\' 1" )      Exam Gen: NAD Abd: soft Rectal: Large anterior skin tag with chronic inflammation noted throughout the posterior portion of her perirectal area     Labs, Imaging and Diagnostic Testing:   Procedure: Anoscopy Surgeon: After the risks and benefits were explained, written consent was obtained for above procedure.  A medical assistant chaperone was present thoroughout the entire procedure.  Anesthesia: none Diagnosis: anal pain Findings: Large anterior skin tag with chronic inflammation noted throughout  the perirectal region consistent with skin breakdown due to irritation from skin tag     Assessment and Plan:  Diagnoses and all orders for this visit:   Anal skin tag     Patient with large anterior skin tag.  It is causing her physical symptoms.  We discussed excision.  She has no sign of internal hemorrhoids.  We discussed risk of pain, bleeding and recurrence.  All questions were answered.  Patient would like to proceed with surgery.

## 2021-10-03 ENCOUNTER — Ambulatory Visit
Admission: RE | Admit: 2021-10-03 | Discharge: 2021-10-03 | Disposition: A | Discharge status: Home / Self Care | Payer: Managed Care, Other (non HMO) | Source: Ambulatory Visit | Attending: Obstetrics & Gynecology | Admitting: Obstetrics & Gynecology

## 2021-10-03 DIAGNOSIS — R9389 Abnormal findings on diagnostic imaging of other specified body structures: Secondary | ICD-10-CM

## 2021-10-03 IMAGING — US US BREAST*R* LIMITED INC AXILLA
1 series · 5 of 5 positions shown · non-contrast
Comparison: Previous exam(s).

CLINICAL DATA: MRI directed second-look ultrasound for 3
indeterminate enhancing masses in the upper outer and upper central
right breast noted on screening MRI performed [DATE].

EXAM:
ULTRASOUND OF THE RIGHT BREAST

[Series 1: us breast*right* limited inc axilla · 0.08mm/px · 5 of 5 slices shown]
[im 1/5]
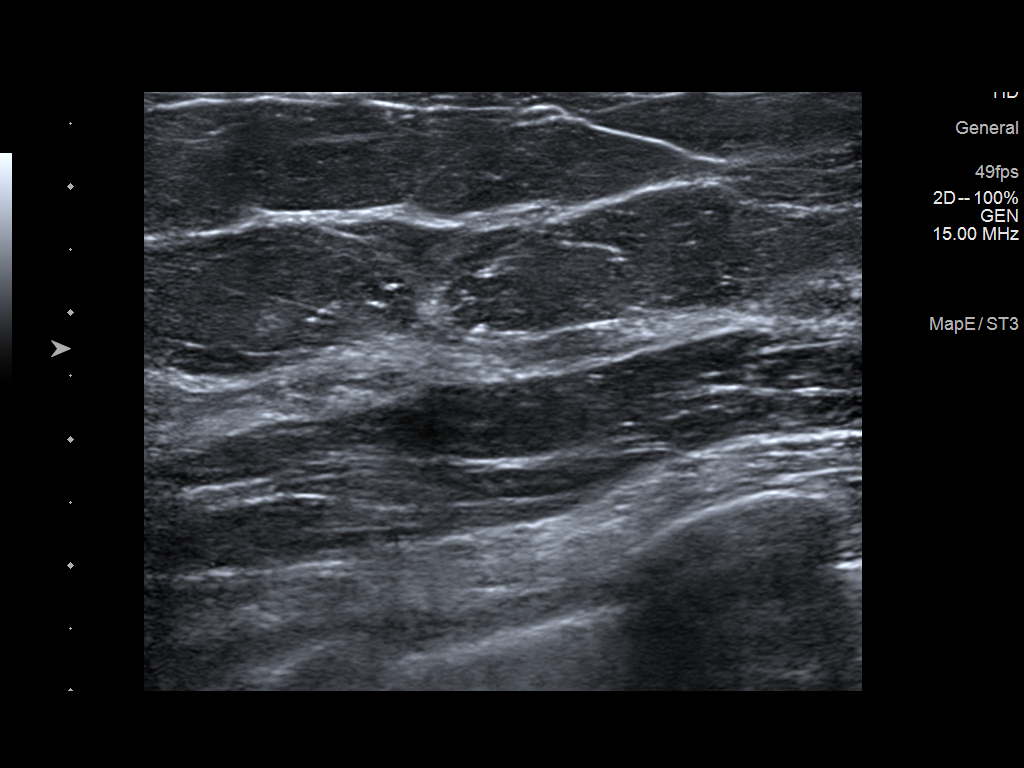
[im 2/5]
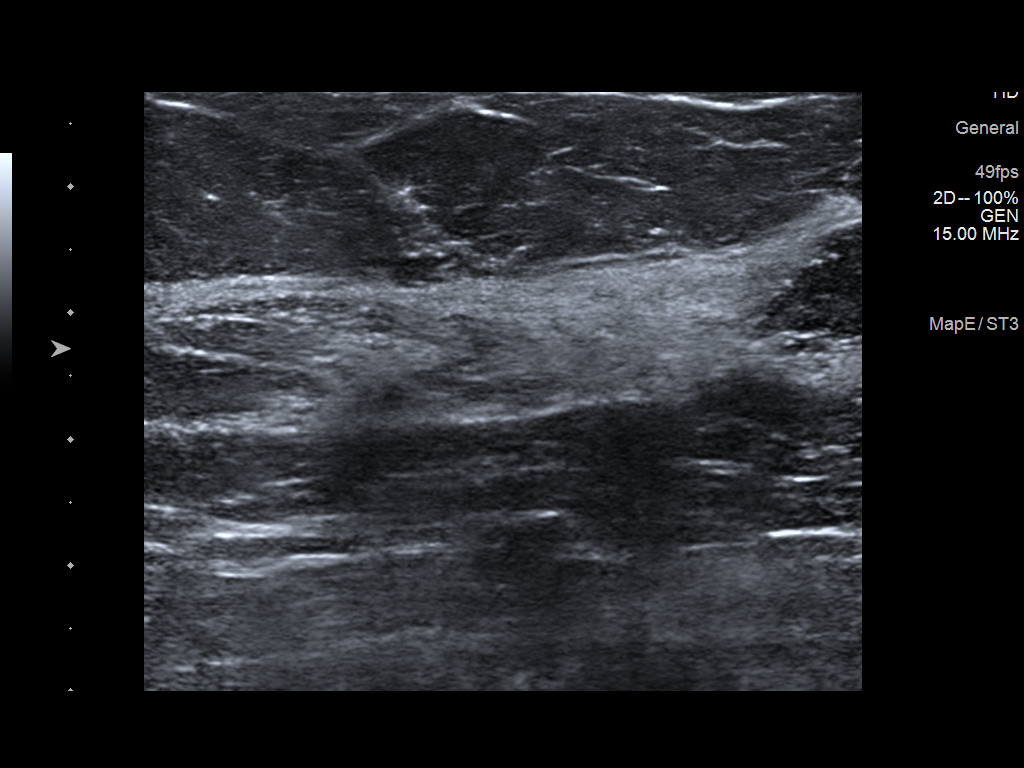
[im 3/5]
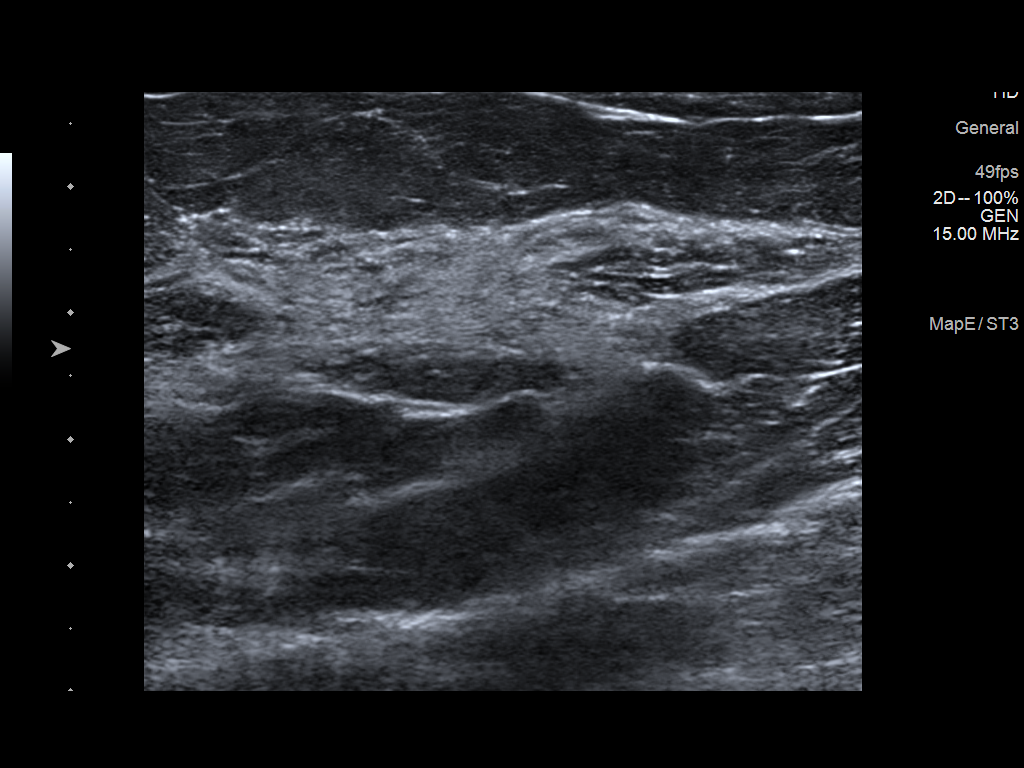
[im 4/5]
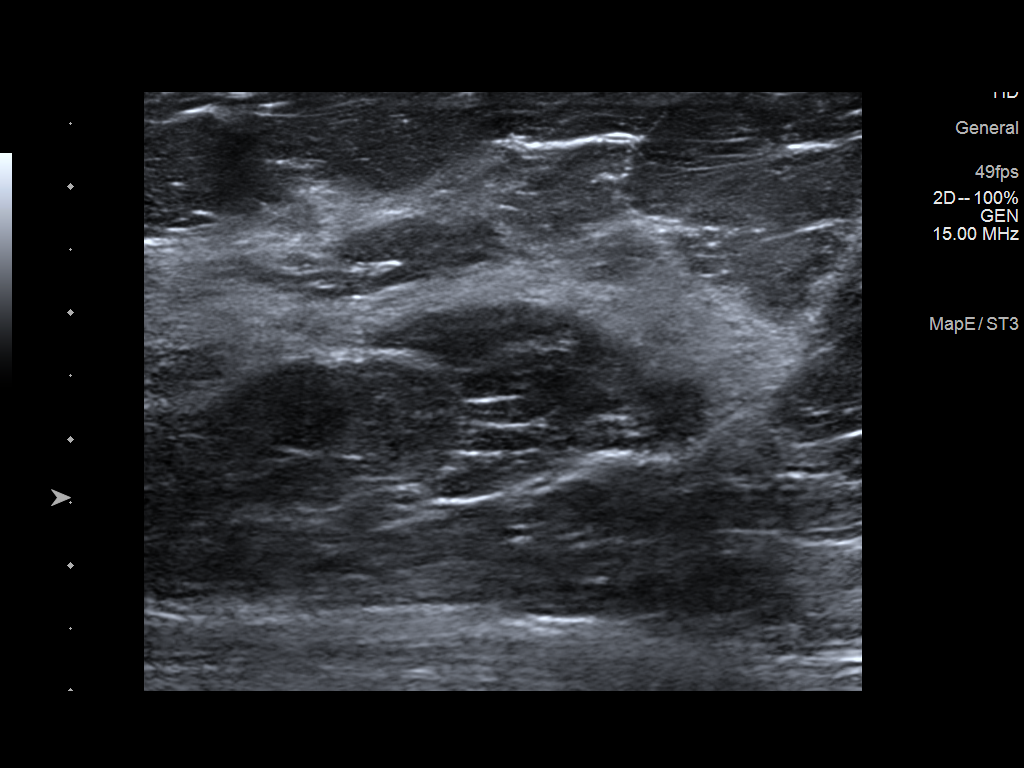
[im 5/5]
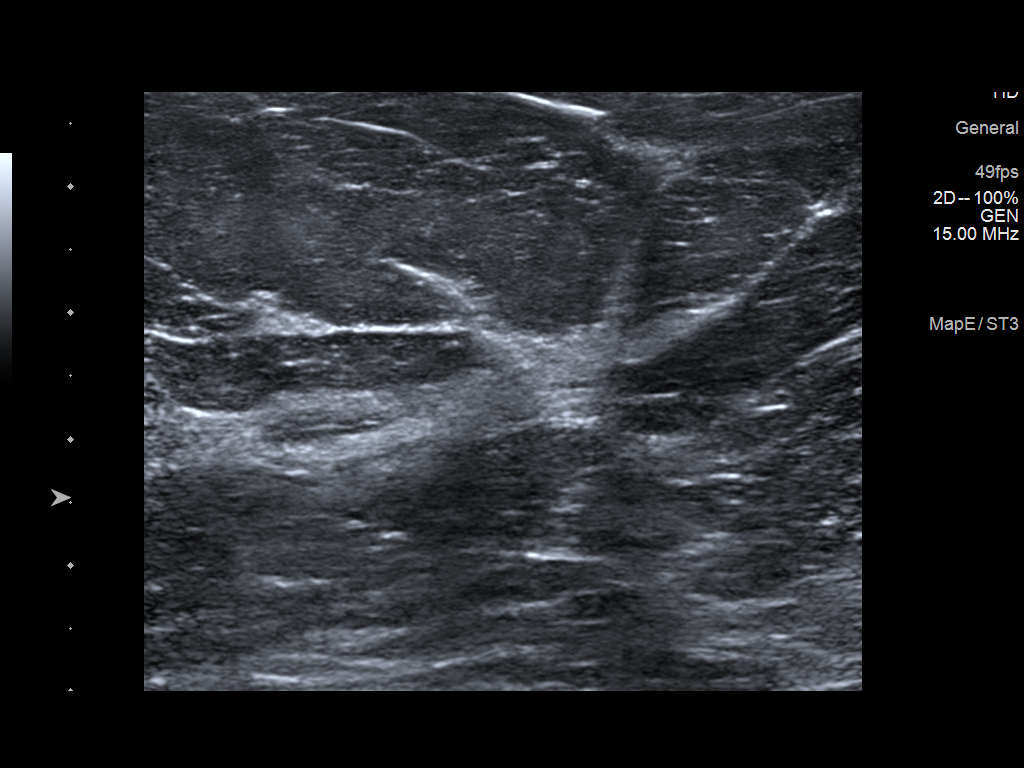

[5 of 5 positions shown; findings below may reference images not displayed]

FINDINGS: Targeted ultrasound of the upper outer and upper central right
breast from the [DATE] to [DATE] positions was performed. No sonographic
correlate identified for any of the 3 enhancing masses seen on prior
MRI. No suspicious solid or cystic mass.
IMPRESSION: No sonographic correlates identified for the 3 enhancing masses in
the upper outer and upper central right breast described on MRI
[DATE]. No sonographic findings of malignancy.

RECOMMENDATION:
Recommend MRI guided biopsy of the largest mass in the upper-outer
right breast. With benign biopsy results, six-month follow-up MRI of
the two other smaller, similar-appearing masses can be performed.

I have discussed the findings and recommendations with the patient.
Patient will be contacted to schedule the MRI guided biopsy at her
earliest convenience.

BI-RADS CATEGORY  1: Negative.

## 2021-10-04 ENCOUNTER — Other Ambulatory Visit: Payer: Self-pay | Admitting: Obstetrics & Gynecology

## 2021-10-04 DIAGNOSIS — R928 Other abnormal and inconclusive findings on diagnostic imaging of breast: Secondary | ICD-10-CM

## 2021-10-10 ENCOUNTER — Ambulatory Visit
Admission: RE | Admit: 2021-10-10 | Discharge: 2021-10-10 | Disposition: A | Discharge status: Home / Self Care | Payer: Managed Care, Other (non HMO) | Source: Ambulatory Visit | Attending: Obstetrics & Gynecology | Admitting: Obstetrics & Gynecology

## 2021-10-10 ENCOUNTER — Other Ambulatory Visit: Payer: Self-pay

## 2021-10-10 DIAGNOSIS — R928 Other abnormal and inconclusive findings on diagnostic imaging of breast: Secondary | ICD-10-CM

## 2021-10-10 DIAGNOSIS — R9389 Abnormal findings on diagnostic imaging of other specified body structures: Secondary | ICD-10-CM

## 2021-10-10 HISTORY — PX: BREAST BIOPSY: SHX20

## 2021-10-10 IMAGING — MR MR BREAST BX W/ LOC DEV 1ST LEASION IMAGE BX SPEC MR GUIDE*R*
7 of 10 series · 32 of 48 positions shown · IV contrast (10 ml gadavist)
Comparison: Previous exams.
COMPARISON: Previous exams.

Addendum:
CLINICAL DATA: 40-year-old female presenting for MRI guided biopsy
of a right breast mass.

EXAM:
MRI GUIDED CORE NEEDLE BIOPSY OF THE RIGHT BREAST
TECHNIQUE: Multiplanar, multisequence MR imaging of the right breast was
performed both before and after administration of intravenous
contrast.
CONTRAST:  10mL GADAVIST GADOBUTROL 1 MMOL/ML IV SOLN

[Series 2: fiducial unilateral · sagittal · 2.0mm · 1.33mm/px · 3 of 52 slices shown]
[im 1/52]
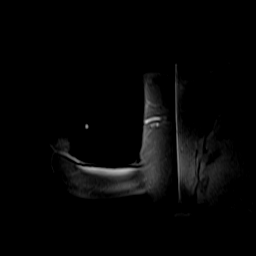
[im 26/52]
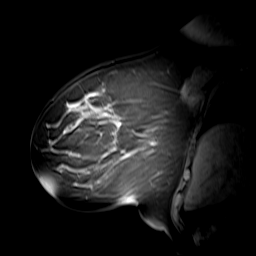
[im 52/52]
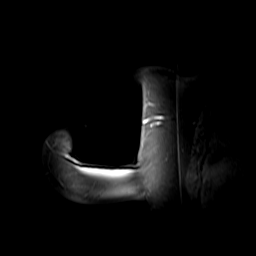

[Series 3: dynamic pre · axial · non-contrast · 1.3mm · 0.73mm/px · z∈[-85,+101]mm · 5 of 144 slices shown]
[im 1/144]
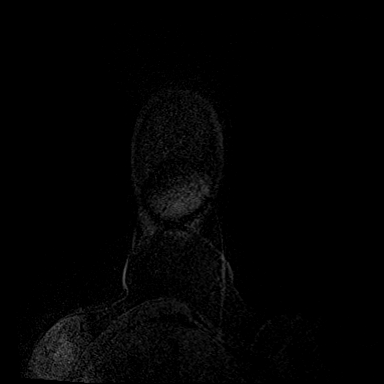
[im 36/144]
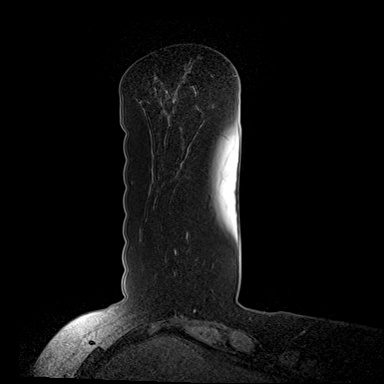
[im 72/144]
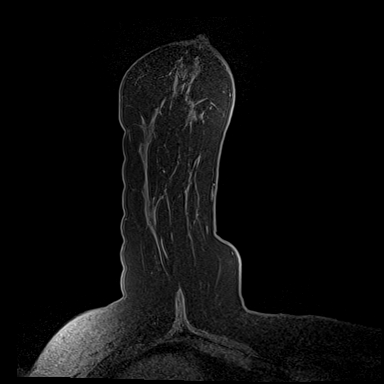
[im 108/144]
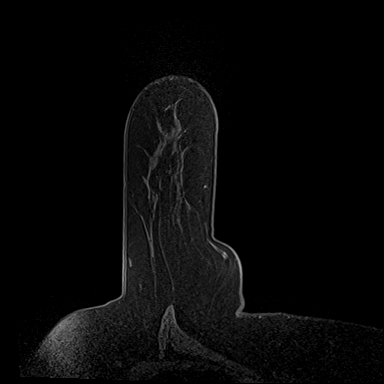
[im 144/144]
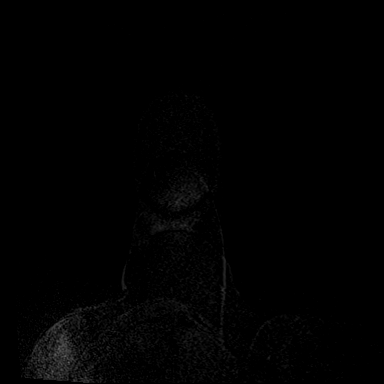

[Series 4: dynamic post 20 · axial · 1.3mm · 0.73mm/px · z∈[-85,+101]mm · 5 of 144 slices shown (1 of 2)]
[im 1/144]
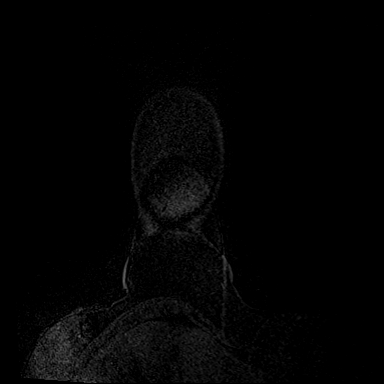
[im 36/144]
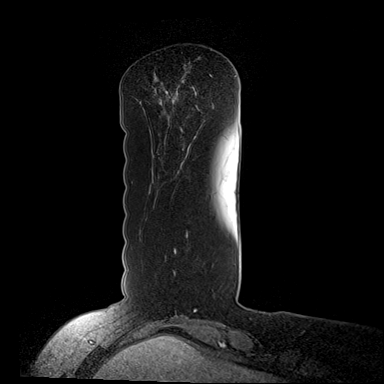
[im 72/144]
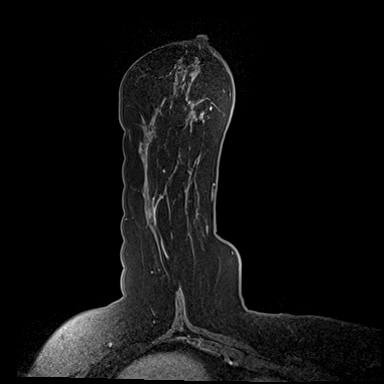
[im 108/144]
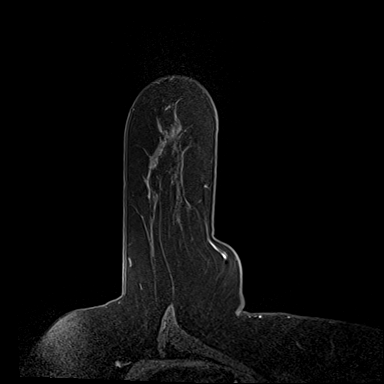
[im 144/144]
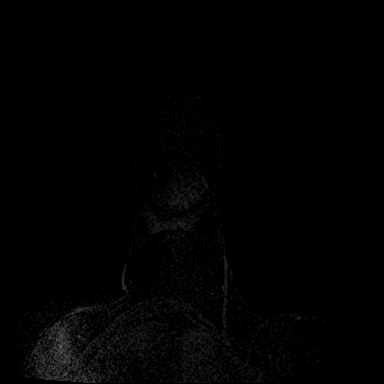

[Series 5: dynamic post 20 · axial · 1.3mm · 0.73mm/px · z∈[-85,+101]mm · 5 of 144 slices shown (2 of 2)]
[im 1/144]
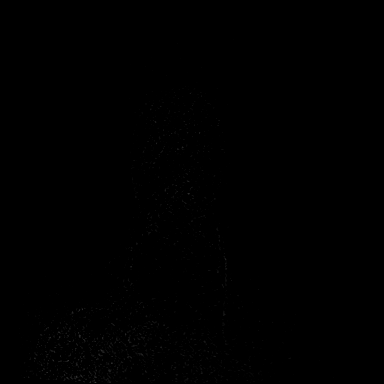
[im 36/144]
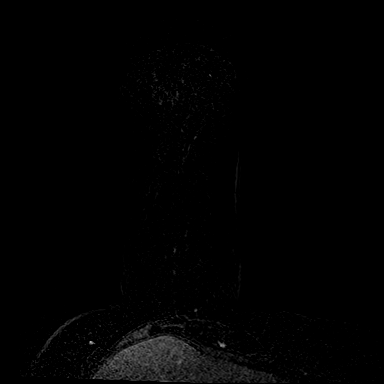
[im 72/144]
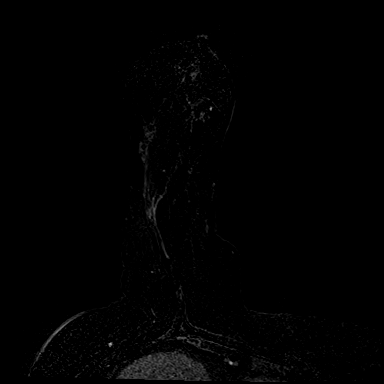
[im 108/144]
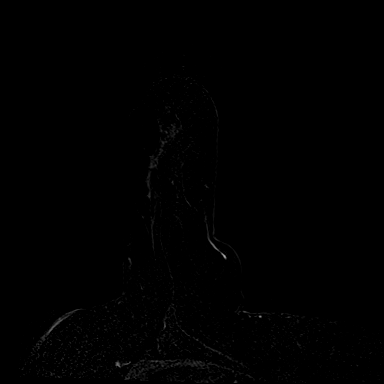
[im 144/144]
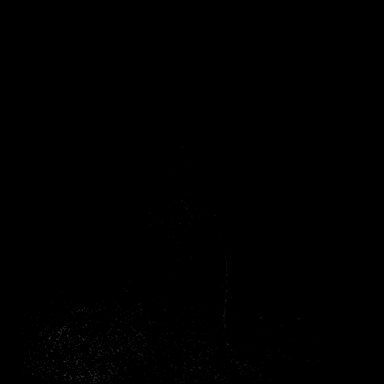

[Series 6: dynamic post 3 · axial · 1.3mm · 0.73mm/px · z∈[-85,+101]mm · 5 of 144 slices shown (1 of 2)]
[im 1/144]
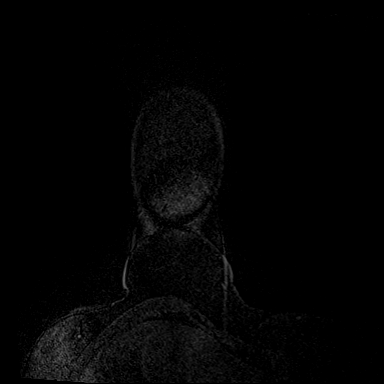
[im 36/144]
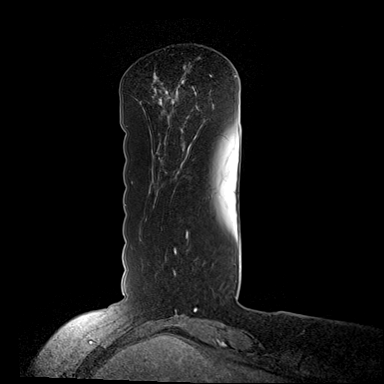
[im 72/144]
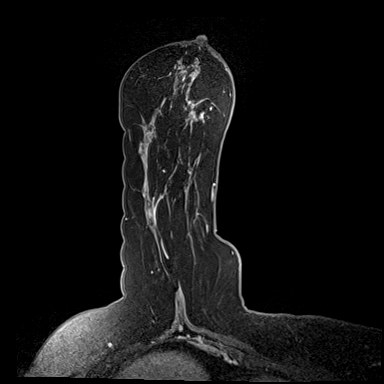
[im 108/144]
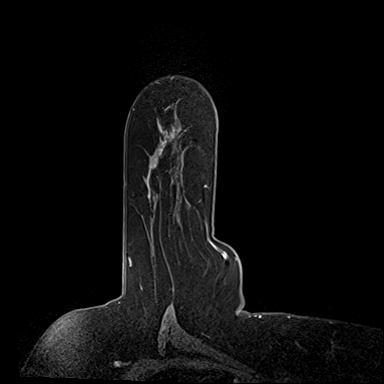
[im 144/144]
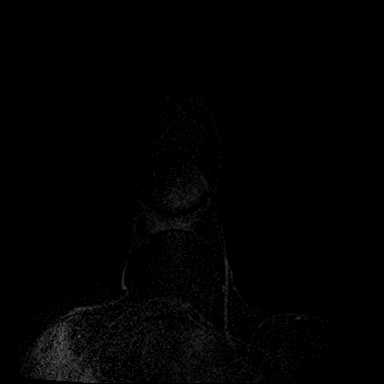

[Series 7: dynamic post 3 · axial · 1.3mm · 0.73mm/px · z∈[-85,+101]mm · 5 of 144 slices shown (2 of 2)]
[im 1/144]
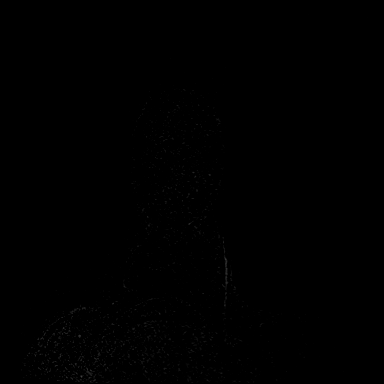
[im 36/144]
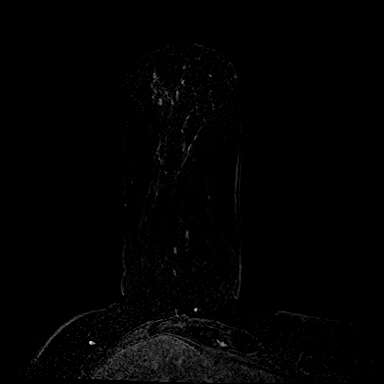
[im 72/144]
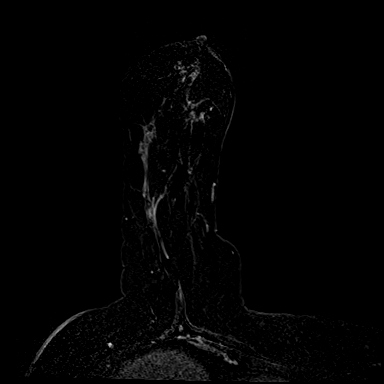
[im 108/144]
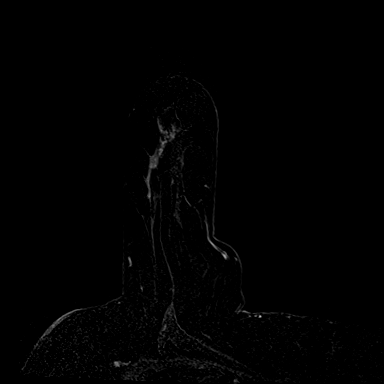
[im 144/144]
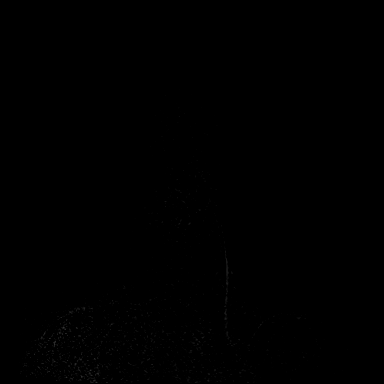

[Series 8: needle confirmation · axial · 1.3mm · 0.73mm/px · z∈[-85,+54]mm · 4 of 144 slices shown]
[im 1/144]
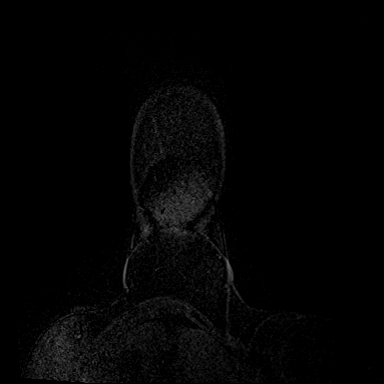
[im 36/144]
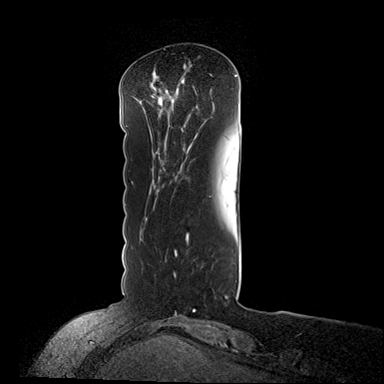
[im 72/144]
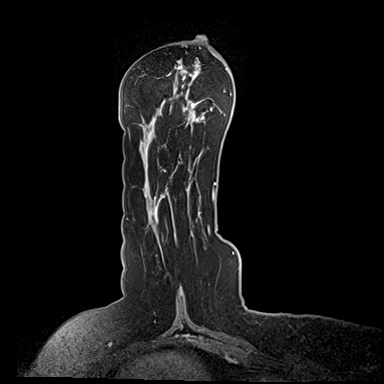
[im 108/144]
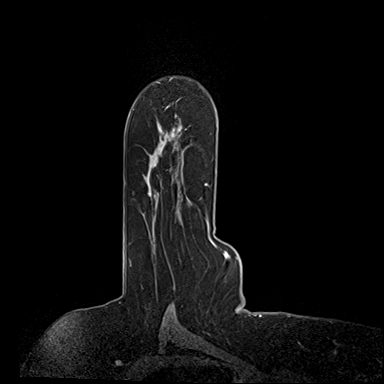

[32 of 48 positions shown; findings below may reference images not displayed]

FINDINGS: I met with the patient, and we discussed the procedure of MRI guided
biopsy, including risks, benefits, and alternatives. Specifically,
we discussed the risks of infection, bleeding, tissue injury, clip
migration, and inadequate sampling. Informed, written consent was
given. The usual time out protocol was performed immediately prior
to the procedure.

Using sterile technique, 1% Lidocaine, MRI guidance, and a 9 gauge
vacuum assisted device, biopsy was performed of a mass in the
upper-outer right breast using a lateral approach. At the conclusion
of the procedure, a dumbbell-shaped tissue marker clip was deployed
into the biopsy cavity. Today, rather than a discrete mass the
region of breast tissue was more diffusely enhancing, which could
represent background parenchymal enhancement. Follow-up 2-view
mammogram was performed and dictated separately.
IMPRESSION: MRI guided biopsy of a possible mass in the upper-outer right
breast. No apparent complications.

ADDENDUM:
Pathology revealed FIBROADENOMATOID NODULE AND PSEUDOANGIOMATOUS
STROMAL HYPERPLASIA of the RIGHT breast, upper outer, (dumbbell
clip). This was found to be concordant by Dr. GULBINOWICZ.

Pathology results were discussed with the patient by telephone. The
patient reported doing well after the biopsy with bleeding and
tenderness at the site. Post biopsy instructions and care were
reviewed and questions were answered. The patient was encouraged to
call The [REDACTED] for any additional
concerns.

A 6 month follow-up MRI to monitor the biopsy site and the
additional 2 [TL] masses in the RIGHT breast. As the areas at
the time of the biopsy had the appearance of background parenchymal
enhancement, if they do not persist on the follow-up MRI, no further
follow-up MRI would be necessary. She may return to her routine
screening plan.

Pathology results reported by GULBINOWICZ, RN on [DATE].

*** End of Addendum ***
FINDINGS: I met with the patient, and we discussed the procedure of MRI guided
biopsy, including risks, benefits, and alternatives. Specifically,
we discussed the risks of infection, bleeding, tissue injury, clip
migration, and inadequate sampling. Informed, written consent was
given. The usual time out protocol was performed immediately prior
to the procedure.

Using sterile technique, 1% Lidocaine, MRI guidance, and a 9 gauge
vacuum assisted device, biopsy was performed of a mass in the
upper-outer right breast using a lateral approach. At the conclusion
of the procedure, a dumbbell-shaped tissue marker clip was deployed
into the biopsy cavity. Today, rather than a discrete mass the
region of breast tissue was more diffusely enhancing, which could
represent background parenchymal enhancement. Follow-up 2-view
mammogram was performed and dictated separately.
IMPRESSION: MRI guided biopsy of a possible mass in the upper-outer right
breast. No apparent complications.

## 2021-10-10 IMAGING — MG MM BREAST LOCALIZATION CLIP
4 series · 4 of 12 positions shown · non-contrast
Comparison: Previous exam(s).

CLINICAL DATA: Post biopsy mammogram of the right breast for clip
placement.

EXAM:
3D DIAGNOSTIC RIGHT MAMMOGRAM POST MRI BIOPSY

[R CC synth-2D]
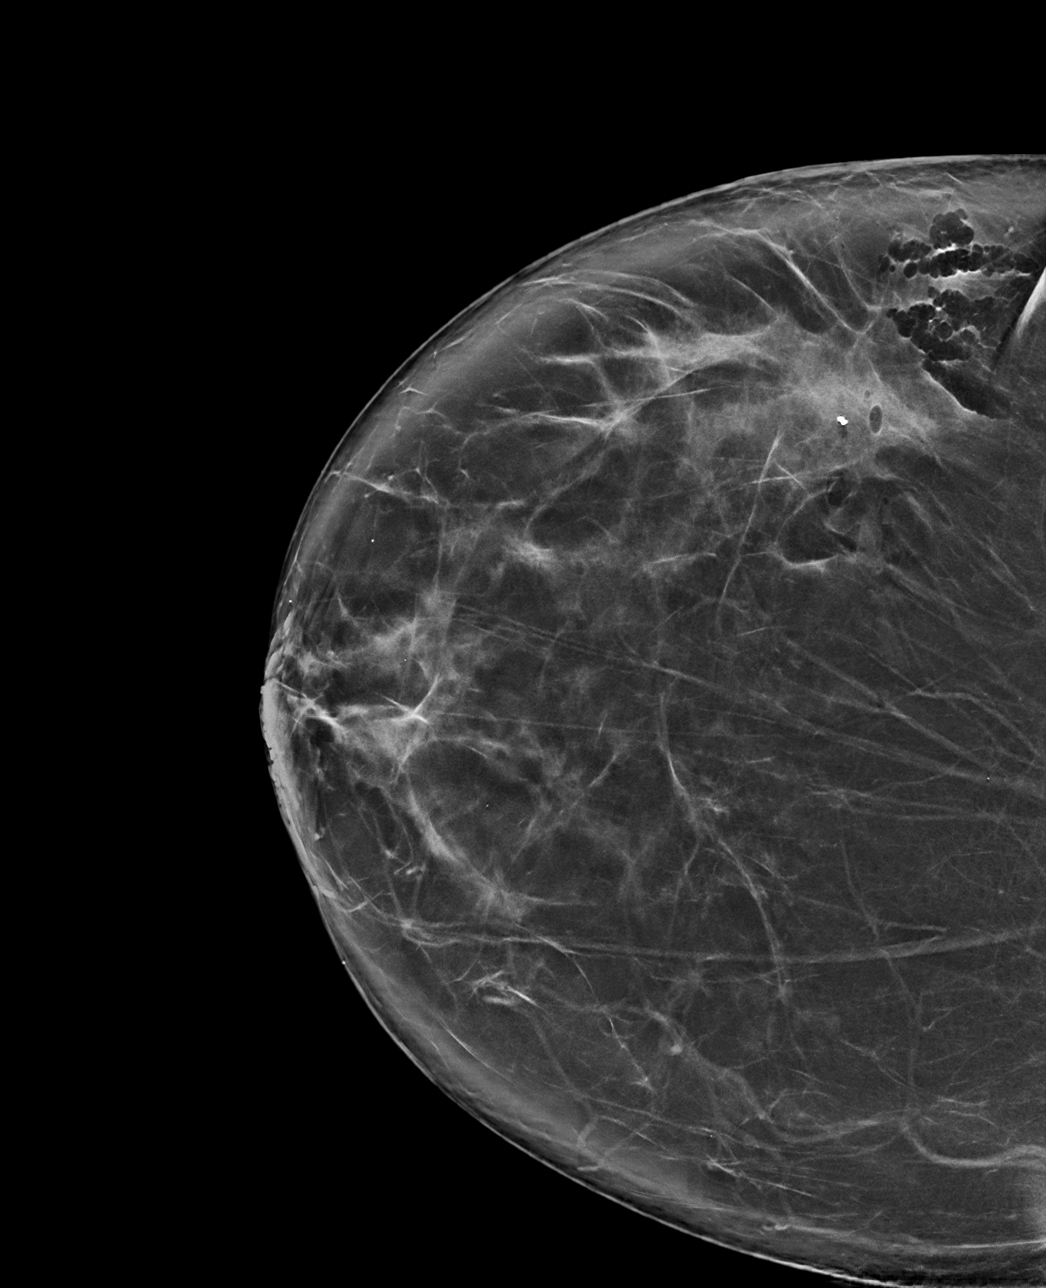

[R ML synth-2D]
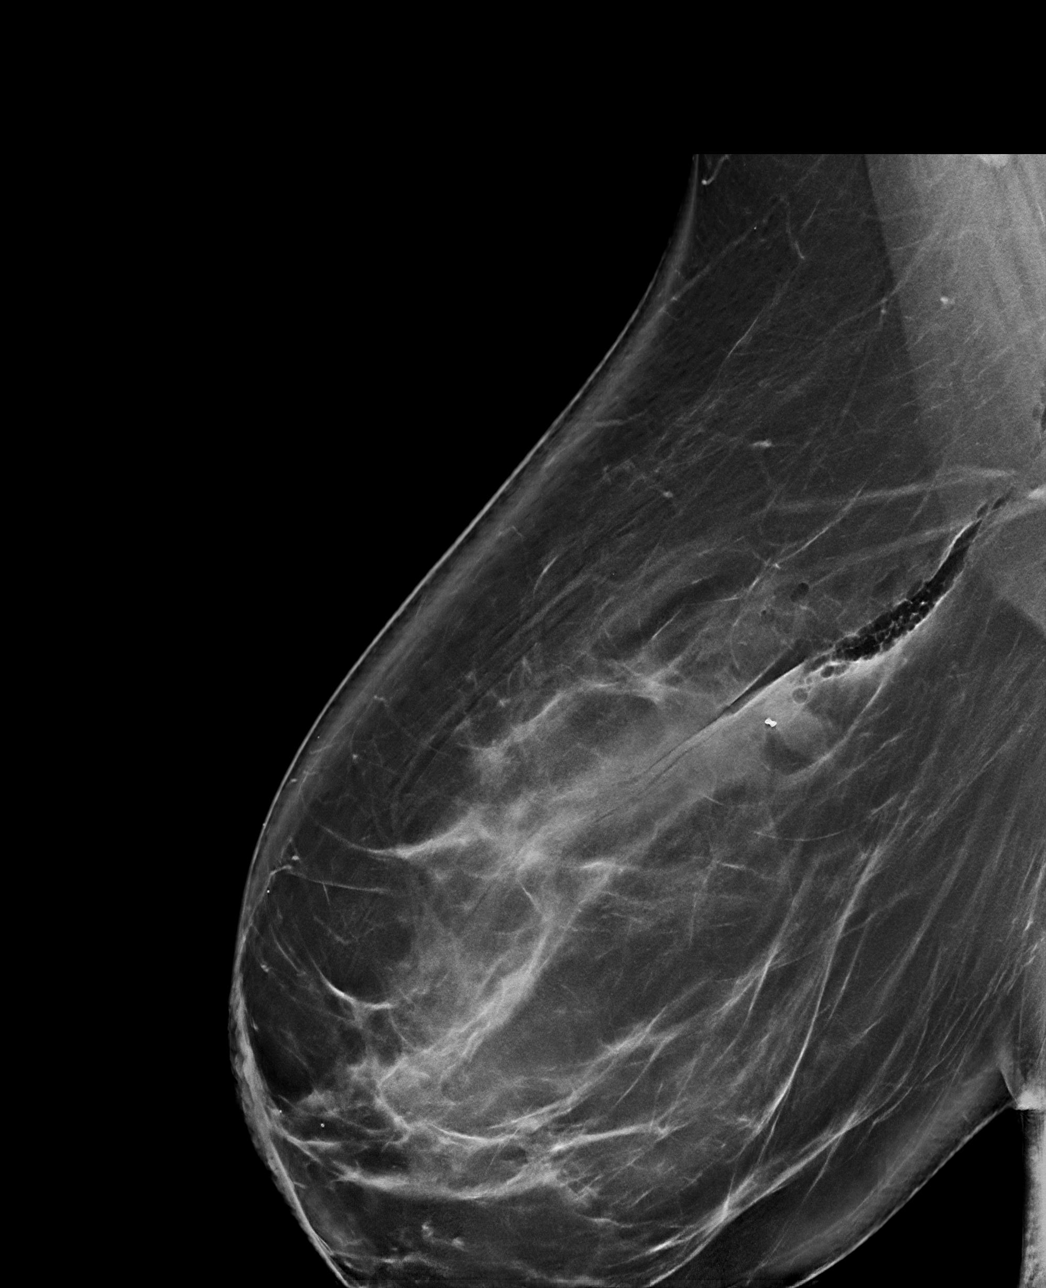

[R CC tomo · tomo slice 51/101.0]
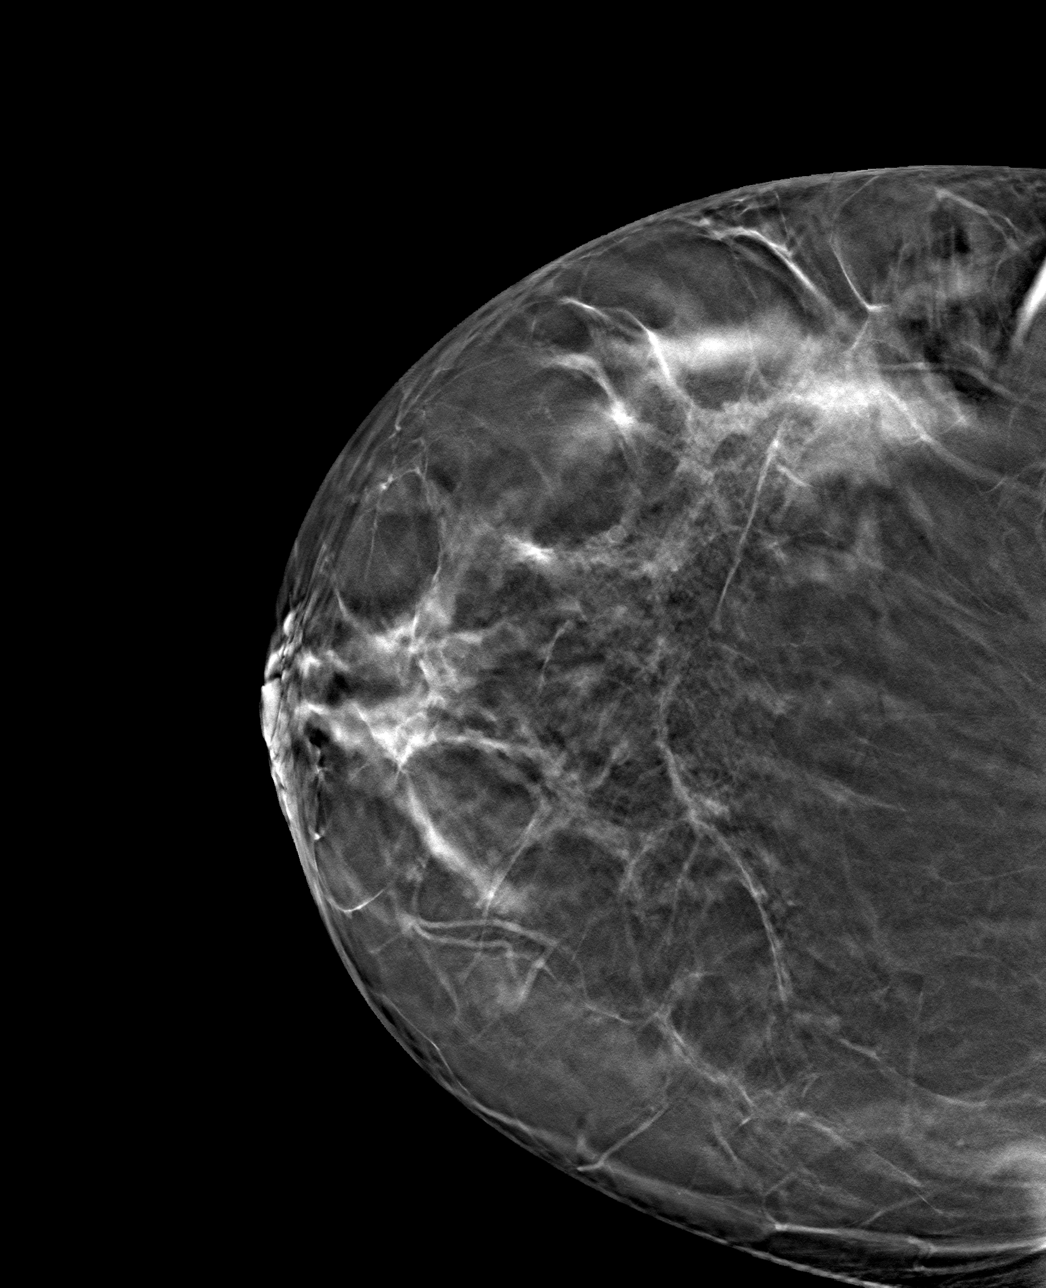

[R ML tomo · tomo slice 59/118.0]
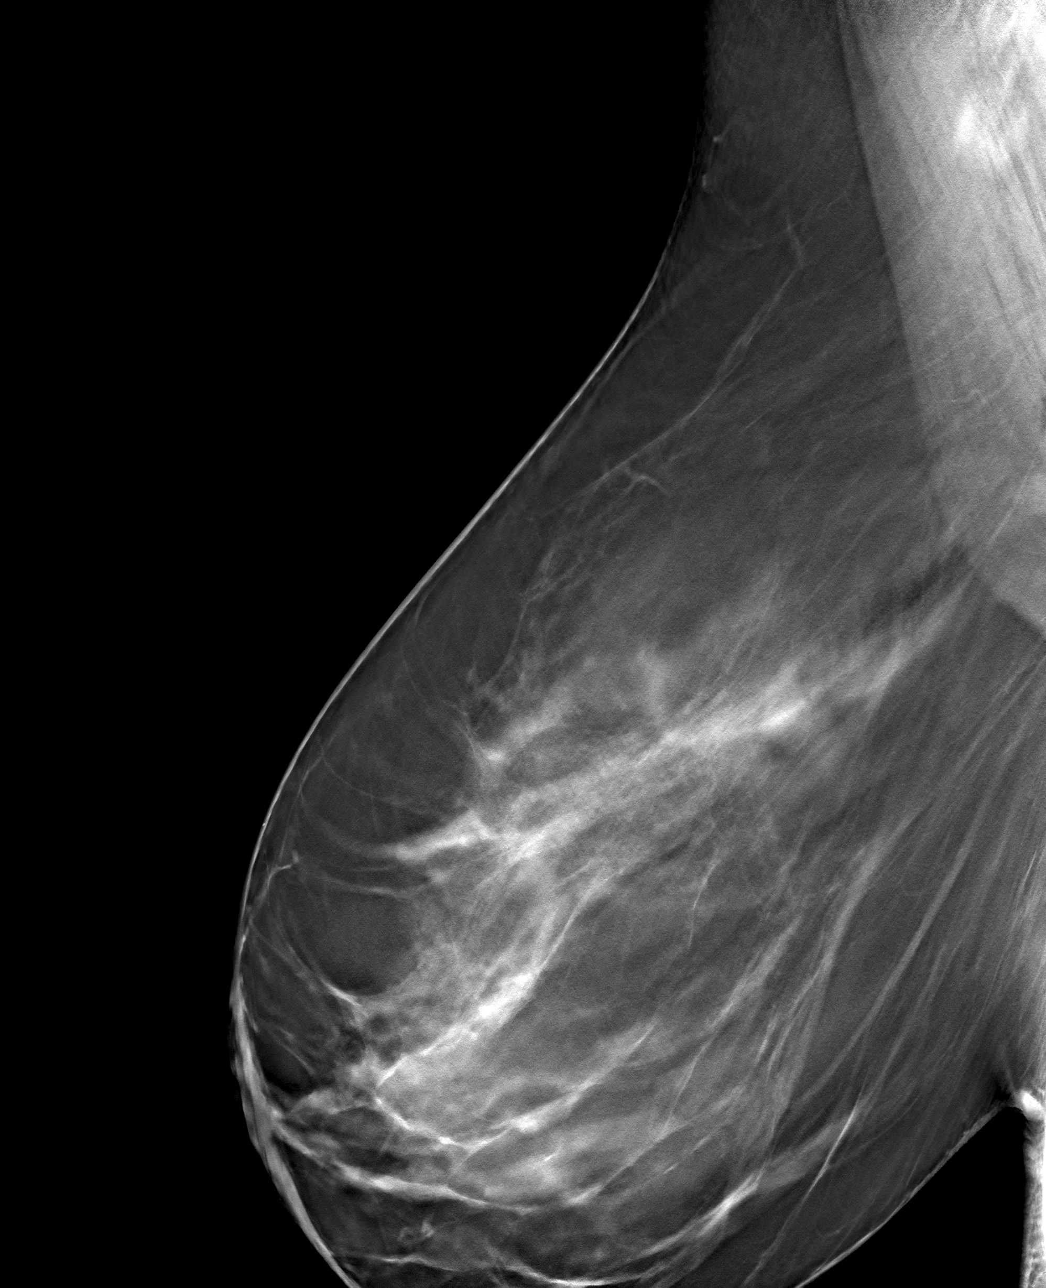

[4 of 12 positions shown; findings below may reference images not displayed]

FINDINGS: 3D Mammographic images were obtained following MRI guided biopsy of
a mass in the upper-outer right breast. The biopsy marking clip is
in expected position at the site of biopsy.
IMPRESSION: Appropriate positioning of the dumbbell shaped biopsy marking clip
at the site of biopsy in the upper-outer right breast.

Final Assessment: Post Procedure Mammograms for Marker Placement

## 2021-10-10 MED ORDER — GADOBUTROL 1 MMOL/ML IV SOLN
10.0000 mL | Freq: Once | INTRAVENOUS | Status: AC | PRN
  Administered 2021-10-10: 10 mL via INTRAVENOUS

## 2021-10-16 ENCOUNTER — Other Ambulatory Visit: Payer: Managed Care, Other (non HMO)

## 2021-11-11 ENCOUNTER — Other Ambulatory Visit: Payer: Self-pay

## 2021-11-11 ENCOUNTER — Encounter (HOSPITAL_BASED_OUTPATIENT_CLINIC_OR_DEPARTMENT_OTHER): Payer: Self-pay | Admitting: General Surgery

## 2021-11-11 NOTE — Progress Notes (Signed)
Spoke w/ via phone for pre-op interview--- pt ?Lab needs dos----  urine preg             ?Lab results------ no ?COVID test -----patient states asymptomatic no test needed ?Arrive at ------- 1000 on 11-15-2021 ?NPO after MN NO Solid Food.  Clear liquids from MN until--- 0900 ?Med rec completed ?Medications to take morning of surgery ----- yaz ?Diabetic medication ----- n/a ?Patient instructed no nail polish to be worn day of surgery ?Patient instructed to bring photo id and insurance card day of surgery ?Patient aware to have Driver (ride ) / caregiver  for 24 hours after surgery --  pt stated husband is available by phone if needed by doctor, but he is not available for ride/ caregiver. ?Pt stated she has arranged for driver/ caregiver from a caregiver agency , [employer], whom will be responsible for pt as driver and caregiver next 24 hours ?Patient Special Instructions ----- n/a ?Pre-Op special Istructions ----- pt is not able to arrive sooner than 1000  ?Patient verbalized understanding of instructions that were given at this phone interview. ?Patient denies shortness of breath, chest pain, fever, cough at this phone interview.  ?

## 2021-11-15 ENCOUNTER — Ambulatory Visit (HOSPITAL_BASED_OUTPATIENT_CLINIC_OR_DEPARTMENT_OTHER): Payer: Managed Care, Other (non HMO) | Admitting: Anesthesiology

## 2021-11-15 ENCOUNTER — Encounter (HOSPITAL_BASED_OUTPATIENT_CLINIC_OR_DEPARTMENT_OTHER): Payer: Self-pay | Admitting: General Surgery

## 2021-11-15 ENCOUNTER — Ambulatory Visit (HOSPITAL_BASED_OUTPATIENT_CLINIC_OR_DEPARTMENT_OTHER)
Admission: RE | Admit: 2021-11-15 | Discharge: 2021-11-15 | Disposition: A | Discharge status: Home / Self Care | Payer: Managed Care, Other (non HMO) | Attending: General Surgery | Admitting: General Surgery

## 2021-11-15 ENCOUNTER — Other Ambulatory Visit: Payer: Self-pay

## 2021-11-15 ENCOUNTER — Encounter (HOSPITAL_BASED_OUTPATIENT_CLINIC_OR_DEPARTMENT_OTHER)
Admission: RE | Disposition: A | Discharge status: Home / Self Care | Payer: Self-pay | Source: Home / Self Care | Attending: General Surgery

## 2021-11-15 DIAGNOSIS — K644 Residual hemorrhoidal skin tags: Secondary | ICD-10-CM | POA: Insufficient documentation

## 2021-11-15 DIAGNOSIS — Z01818 Encounter for other preprocedural examination: Secondary | ICD-10-CM

## 2021-11-15 DIAGNOSIS — Z6841 Body Mass Index (BMI) 40.0 and over, adult: Secondary | ICD-10-CM | POA: Diagnosis not present

## 2021-11-15 DIAGNOSIS — R197 Diarrhea, unspecified: Secondary | ICD-10-CM | POA: Insufficient documentation

## 2021-11-15 HISTORY — PX: EXCISION OF SKIN TAG: SHX6270

## 2021-11-15 HISTORY — DX: Residual hemorrhoidal skin tags: K64.4

## 2021-11-15 HISTORY — DX: Personal history of diseases of the blood and blood-forming organs and certain disorders involving the immune mechanism: Z86.2

## 2021-11-15 HISTORY — DX: Presence of spectacles and contact lenses: Z97.3

## 2021-11-15 HISTORY — DX: Unspecified hemorrhoids: K64.9

## 2021-11-15 LAB — POCT PREGNANCY, URINE: Preg Test, Ur: NEGATIVE

## 2021-11-15 SURGERY — EXCISION, SKIN TAG
Anesthesia: Monitor Anesthesia Care | Site: Anus

## 2021-11-15 MED ORDER — OXYCODONE HCL 5 MG PO TABS
5.0000 mg | ORAL_TABLET | Freq: Once | ORAL | Status: AC | PRN
  Administered 2021-11-15: 5 mg via ORAL

## 2021-11-15 MED ORDER — BUPIVACAINE-EPINEPHRINE 0.5% -1:200000 IJ SOLN
INTRAMUSCULAR | Status: DC | PRN
  Administered 2021-11-15: 25 mL

## 2021-11-15 MED ORDER — LIDOCAINE 5 % EX OINT
TOPICAL_OINTMENT | CUTANEOUS | Status: DC | PRN
  Administered 2021-11-15: 1

## 2021-11-15 MED ORDER — GLYCOPYRROLATE PF 0.2 MG/ML IJ SOSY
PREFILLED_SYRINGE | INTRAMUSCULAR | Status: DC | PRN
  Administered 2021-11-15: .2 mg via INTRAVENOUS

## 2021-11-15 MED ORDER — ACETAMINOPHEN 500 MG PO TABS
ORAL_TABLET | ORAL | Status: AC
  Filled 2021-11-15: qty 2

## 2021-11-15 MED ORDER — FENTANYL CITRATE (PF) 100 MCG/2ML IJ SOLN
INTRAMUSCULAR | Status: AC
  Filled 2021-11-15: qty 2

## 2021-11-15 MED ORDER — GABAPENTIN 300 MG PO CAPS
ORAL_CAPSULE | ORAL | Status: AC
  Filled 2021-11-15: qty 1

## 2021-11-15 MED ORDER — AMISULPRIDE (ANTIEMETIC) 5 MG/2ML IV SOLN: 10.0000 mg | Freq: Once | INTRAVENOUS | Status: DC | PRN

## 2021-11-15 MED ORDER — CELECOXIB 200 MG PO CAPS
ORAL_CAPSULE | ORAL | Status: AC
  Filled 2021-11-15: qty 2

## 2021-11-15 MED ORDER — MIDAZOLAM HCL 2 MG/2ML IJ SOLN
INTRAMUSCULAR | Status: AC
  Filled 2021-11-15: qty 2

## 2021-11-15 MED ORDER — CELECOXIB 200 MG PO CAPS
200.0000 mg | ORAL_CAPSULE | ORAL | Status: AC
  Administered 2021-11-15: 200 mg via ORAL

## 2021-11-15 MED ORDER — SODIUM CHLORIDE 0.9% FLUSH: 3.0000 mL | Freq: Two times a day (BID) | INTRAVENOUS | Status: DC

## 2021-11-15 MED ORDER — LACTATED RINGERS IV SOLN: INTRAVENOUS | Status: DC

## 2021-11-15 MED ORDER — GABAPENTIN 300 MG PO CAPS
300.0000 mg | ORAL_CAPSULE | ORAL | Status: AC
  Administered 2021-11-15: 300 mg via ORAL

## 2021-11-15 MED ORDER — PROPOFOL 10 MG/ML IV BOLUS
INTRAVENOUS | Status: DC | PRN
  Administered 2021-11-15 (×2): 20 mg via INTRAVENOUS

## 2021-11-15 MED ORDER — SUCCINYLCHOLINE CHLORIDE 200 MG/10ML IV SOSY
PREFILLED_SYRINGE | INTRAVENOUS | Status: DC | PRN
  Administered 2021-11-15: 100 mg via INTRAVENOUS

## 2021-11-15 MED ORDER — FENTANYL CITRATE (PF) 250 MCG/5ML IJ SOLN
INTRAMUSCULAR | Status: DC | PRN
Start: 2021-11-15 — End: 2021-11-15
  Administered 2021-11-15 (×2): 50 ug via INTRAVENOUS

## 2021-11-15 MED ORDER — OXYCODONE HCL 5 MG PO TABS
5.0000 mg | ORAL_TABLET | Freq: Four times a day (QID) | ORAL | 0 refills | Status: DC | PRN
Start: 2021-11-15 — End: 2021-11-20

## 2021-11-15 MED ORDER — ONDANSETRON HCL 4 MG/2ML IJ SOLN: 4.0000 mg | Freq: Once | INTRAMUSCULAR | Status: DC | PRN

## 2021-11-15 MED ORDER — DEXAMETHASONE SODIUM PHOSPHATE 10 MG/ML IJ SOLN
INTRAMUSCULAR | Status: DC | PRN
  Administered 2021-11-15: 10 mg via INTRAVENOUS

## 2021-11-15 MED ORDER — KETOROLAC TROMETHAMINE 30 MG/ML IJ SOLN: 30.0000 mg | Freq: Once | INTRAMUSCULAR | Status: DC | PRN

## 2021-11-15 MED ORDER — BUPIVACAINE LIPOSOME 1.3 % IJ SUSP: 20.0000 mL | Freq: Once | INTRAMUSCULAR | Status: DC

## 2021-11-15 MED ORDER — OXYCODONE HCL 5 MG/5ML PO SOLN: 5.0000 mg | Freq: Once | ORAL | Status: AC | PRN

## 2021-11-15 MED ORDER — LIDOCAINE 2% (20 MG/ML) 5 ML SYRINGE
INTRAMUSCULAR | Status: DC | PRN
  Administered 2021-11-15: 40 mg via INTRAVENOUS

## 2021-11-15 MED ORDER — ACETAMINOPHEN 500 MG PO TABS
1000.0000 mg | ORAL_TABLET | ORAL | Status: AC
  Administered 2021-11-15: 1000 mg via ORAL

## 2021-11-15 MED ORDER — ONDANSETRON HCL 4 MG/2ML IJ SOLN
INTRAMUSCULAR | Status: DC | PRN
  Administered 2021-11-15: 4 mg via INTRAVENOUS

## 2021-11-15 MED ORDER — FENTANYL CITRATE (PF) 100 MCG/2ML IJ SOLN: 25.0000 ug | INTRAMUSCULAR | Status: DC | PRN

## 2021-11-15 MED ORDER — 0.9 % SODIUM CHLORIDE (POUR BTL) OPTIME
TOPICAL | Status: DC | PRN
  Administered 2021-11-15: 500 mL

## 2021-11-15 MED ORDER — PROPOFOL 500 MG/50ML IV EMUL
INTRAVENOUS | Status: DC | PRN
  Administered 2021-11-15: 200 ug/kg/min via INTRAVENOUS

## 2021-11-15 MED ORDER — OXYCODONE HCL 5 MG PO TABS
ORAL_TABLET | ORAL | Status: AC
Start: 2021-11-15 — End: ?
  Filled 2021-11-15: qty 1

## 2021-11-15 MED ORDER — MIDAZOLAM HCL 2 MG/2ML IJ SOLN
INTRAMUSCULAR | Status: DC | PRN
  Administered 2021-11-15 (×2): 1 mg via INTRAVENOUS

## 2021-11-15 MED ORDER — ACETAMINOPHEN 500 MG PO TABS: 1000.0000 mg | ORAL_TABLET | Freq: Once | ORAL | Status: DC

## 2021-11-15 SURGICAL SUPPLY — 45 items
ADH SKN CLS APL DERMABOND .7 (GAUZE/BANDAGES/DRESSINGS) ×1
APL PRP STRL LF DISP 70% ISPRP (MISCELLANEOUS)
APL SKNCLS STERI-STRIP NONHPOA (GAUZE/BANDAGES/DRESSINGS)
BENZOIN TINCTURE PRP APPL 2/3 (GAUZE/BANDAGES/DRESSINGS) IMPLANT
BLADE CLIPPER SENSICLIP SURGIC (BLADE) IMPLANT
BLADE SURG 15 STRL LF DISP TIS (BLADE) IMPLANT
BLADE SURG 15 STRL SS (BLADE)
CHLORAPREP W/TINT 26 (MISCELLANEOUS) IMPLANT
COVER BACK TABLE 60X90IN (DRAPES) ×2 IMPLANT
COVER MAYO STAND STRL (DRAPES) ×2 IMPLANT
DECANTER SPIKE VIAL GLASS SM (MISCELLANEOUS) IMPLANT
DERMABOND ADVANCED (GAUZE/BANDAGES/DRESSINGS) ×1
DERMABOND ADVANCED .7 DNX12 (GAUZE/BANDAGES/DRESSINGS) ×1 IMPLANT
DRAPE HYSTEROSCOPY (MISCELLANEOUS) IMPLANT
DRAPE LAPAROTOMY 100X72 PEDS (DRAPES) ×2 IMPLANT
DRAPE SHEET LG 3/4 BI-LAMINATE (DRAPES) IMPLANT
DRAPE UTILITY XL STRL (DRAPES) ×2 IMPLANT
DRSG PAD ABDOMINAL 8X10 ST (GAUZE/BANDAGES/DRESSINGS) ×1 IMPLANT
DRSG TEGADERM 4X4.75 (GAUZE/BANDAGES/DRESSINGS) IMPLANT
ELECT REM PT RETURN 9FT ADLT (ELECTROSURGICAL) ×2
ELECTRODE REM PT RTRN 9FT ADLT (ELECTROSURGICAL) ×1 IMPLANT
GAUZE SPONGE 4X4 12PLY STRL (GAUZE/BANDAGES/DRESSINGS) IMPLANT
GLOVE SURG ENC MOIS LTX SZ6.5 (GLOVE) ×2 IMPLANT
GLOVE SURG UNDER LTX SZ6.5 (GLOVE) ×2 IMPLANT
KIT SIGMOIDOSCOPE (SET/KITS/TRAYS/PACK) IMPLANT
KIT TURNOVER CYSTO (KITS) ×2 IMPLANT
LEGGING LITHOTOMY PAIR STRL (DRAPES) ×1 IMPLANT
NEEDLE HYPO 22GX1.5 SAFETY (NEEDLE) ×2 IMPLANT
NS IRRIG 500ML POUR BTL (IV SOLUTION) IMPLANT
PAD ARMBOARD 7.5X6 YLW CONV (MISCELLANEOUS) IMPLANT
PANTS MESH DISP LRG (UNDERPADS AND DIAPERS) ×1 IMPLANT
PANTS MESH DISPOSABLE L (UNDERPADS AND DIAPERS) ×1
PENCIL SMOKE EVACUATOR (MISCELLANEOUS) ×2 IMPLANT
STRIP CLOSURE SKIN 1/2X4 (GAUZE/BANDAGES/DRESSINGS) IMPLANT
SUT CHROMIC 2 0 SH (SUTURE) ×2 IMPLANT
SUT CHROMIC 3 0 SH 27 (SUTURE) ×2 IMPLANT
SUT VIC AB 2-0 SH 27 (SUTURE)
SUT VIC AB 2-0 SH 27XBRD (SUTURE) IMPLANT
SYR CONTROL 10ML LL (SYRINGE) ×2 IMPLANT
TOWEL OR 17X26 10 PK STRL BLUE (TOWEL DISPOSABLE) ×2 IMPLANT
TRAY DSU PREP LF (CUSTOM PROCEDURE TRAY) ×2 IMPLANT
TUBE CONNECTING 12X1/4 (SUCTIONS) IMPLANT
UNDERPAD 30X36 HEAVY ABSORB (UNDERPADS AND DIAPERS) IMPLANT
WATER STERILE IRR 1000ML POUR (IV SOLUTION) ×2 IMPLANT
YANKAUER SUCT BULB TIP NO VENT (SUCTIONS) IMPLANT

## 2021-11-15 NOTE — H&P (Signed)
?  ?  ?REFERRING PHYSICIAN:  Charna Elizabeth, MD ?  ?PROVIDER:  Elenora Gamma, MD ?  ?MRN: [id number] ?DOB: [birth year] ? ?  ?Subjective  ?  ?Chief Complaint: Hemorrhoids ?  ?  ?  ?History of Present Illness: ?Breanna Dunn is a 41 y.o. female who is seen today as an office consultation at the request of Dr. Elnoria Howard for evaluation of Hemorrhoids ?  41 year old female who presents to the office for evaluation of anal discomfort and a hemorrhoid.  She states that she has had this for approximately 5 years.  It became very difficult to clean and can become very inflamed at times.  She states that she has loose to watery stools on a more consistent basis and denies any constipation.  This area becomes more inflamed when she has more bowel movements.  At baseline she has approximately 2/day.  She occasionally has bleeding as well ?  ?  ?  ?Review of Systems: ?A complete review of systems was obtained from the patient.  I have reviewed this information and discussed as appropriate with the patient.  See HPI as well for other ROS. ?  ?  ?  ?Medical History: ?Past Medical History  ?History reviewed. No pertinent past medical history.  ?  ?  ?There is no problem list on file for this patient. ?  ?  ?Past Surgical History  ?         ?Past Surgical History:  ?Procedure Laterality Date  ? LAPAROSCOPIC CHOLECYSTECTOMY      ?  ?  ?  ?Allergies  ?       ?Allergies  ?Allergen Reactions  ? Sulfamethoxazole-Trimethoprim Other (See Comments)  ?  ?  ?  ?           ?Current Outpatient Medications on File Prior to Visit  ?Medication Sig Dispense Refill  ? drospirenone-ethinyl estradioL (YAZ) 3-0.02 mg tablet Take 1 tablet by mouth once daily      ? folic acid (FOLVITE) 1 MG tablet        ?  ?No current facility-administered medications on file prior to visit.  ?  ?  ?Family History  ?         ?Family History  ?Problem Relation Age of Onset  ? Colon cancer Mother    ? Stroke Mother    ?  ?  ?  ?Social History  ?  ?     ?Tobacco Use   ?Smoking Status Never  ?Smokeless Tobacco Never  ?  ?  ?Social History  ?Social History  ?  ?       ?Socioeconomic History  ? Marital status: Married  ?Tobacco Use  ? Smoking status: Never  ? Smokeless tobacco: Never  ?Substance and Sexual Activity  ? Alcohol use: Yes  ? Drug use: Never  ?  ?  ?  ?Objective:  ?  ?Vitals:  ? 11/15/21 1013  ?BP: 122/81  ?Pulse: 84  ?Resp: 16  ?Temp: 98.4 ?F (36.9 ?C)  ? ? ?  ?Exam ?Gen: NAD ?Abd: soft ?Rectal: Large anterior skin tag with chronic inflammation noted throughout the posterior portion of her perirectal area ?  ?  ?Labs, Imaging and Diagnostic Testing: ?  ?Procedure: Anoscopy 09/30/2021 ?Surgeon: Maisie Fus ?After the risks and benefits were explained, written consent was obtained for above procedure.  A medical assistant chaperone was present thoroughout the entire procedure.  ?Anesthesia: none ?Diagnosis: anal pain ?Findings: Large anterior skin tag with chronic inflammation noted  throughout the perirectal region consistent with skin breakdown due to irritation from skin tag ?  ?  ?Assessment and Plan:  ?Diagnoses and all orders for this visit: ?  ?Anal skin tag ?  ?  ?Patient with large anterior skin tag.  It is causing her physical symptoms.  We discussed excision.  She has no sign of internal hemorrhoids.  We discussed risk of pain, bleeding and recurrence.  All questions were answered.  Patient would like to proceed with surgery. ?

## 2021-11-15 NOTE — Anesthesia Preprocedure Evaluation (Addendum)
Anesthesia Evaluation  ?Patient identified by MRN, date of birth, ID band ?Patient awake ? ? ? ?Reviewed: ?Allergy & Precautions, NPO status , Patient's Chart, lab work & pertinent test results ? ?Airway ?Mallampati: III ? ?TM Distance: >3 FB ?Neck ROM: Full ? ? ? Dental ? ?(+) Teeth Intact, Dental Advisory Given ?  ?Pulmonary ? ?Snores at night, no witness apneas, has never has sleep study  ?  ?Pulmonary exam normal ?breath sounds clear to auscultation ? ? ? ? ? ? Cardiovascular ?negative cardio ROS ?Normal cardiovascular exam ?Rhythm:Regular Rate:Normal ? ? ?  ?Neuro/Psych ?negative neurological ROS ? negative psych ROS  ? GI/Hepatic ?negative GI ROS, Neg liver ROS,   ?Endo/Other  ?Morbid obesityBMI 42 ? Renal/GU ?negative Renal ROS  ?negative genitourinary ?  ?Musculoskeletal ?negative musculoskeletal ROS ?(+)  ? Abdominal ?(+) + obese,   ?Peds ? Hematology ?negative hematology ROS ?(+)   ?Anesthesia Other Findings ?Anal skin tag ? Reproductive/Obstetrics ?negative OB ROS ? ?  ? ? ? ? ? ? ? ? ? ? ? ? ? ?  ?  ? ? ? ? ? ? ? ?Anesthesia Physical ?Anesthesia Plan ? ?ASA: 3 ? ?Anesthesia Plan:   ? ?Post-op Pain Management: Tylenol PO (pre-op)*  ? ?Induction:  ? ?PONV Risk Score and Plan: Propofol infusion, TIVA, Midazolam and Treatment may vary due to age or medical condition ? ?Airway Management Planned: Natural Airway and Simple Face Mask ? ?Additional Equipment: None ? ?Intra-op Plan:  ? ?Post-operative Plan:  ? ?Informed Consent: I have reviewed the patients History and Physical, chart, labs and discussed the procedure including the risks, benefits and alternatives for the proposed anesthesia with the patient or authorized representative who has indicated his/her understanding and acceptance.  ? ? ? ?Dental advisory given ? ?Plan Discussed with: CRNA ? ?Anesthesia Plan Comments:   ? ? ? ? ? ?Anesthesia Quick Evaluation ? ?

## 2021-11-15 NOTE — Discharge Instructions (Addendum)
ANORECTAL SURGERY: POST OP INSTRUCTIONS ?Take your usually prescribed home medications unless otherwise directed. ?DIET: During the first few hours after surgery sip on some liquids until you are able to urinate.  It is normal to not urinate for several hours after this surgery.  If you feel uncomfortable, please contact the office for instructions.  After you are able to urinate,you may eat, if you feel like it.  Follow a light bland diet the first 24 hours after arrival home, such as soup, liquids, crackers, etc.  Be sure to include lots of fluids daily (6-8 glasses).  Avoid fast food or heavy meals, as your are more likely to get nauseated.  Eat a low fat diet the next few days after surgery.  Limit caffeine intake to 1-2 servings a day. ?PAIN CONTROL: ?Pain is best controlled by a usual combination of several different methods TOGETHER: ?Muscle relaxation: Soak in a warm bath (or Sitz bath) three times a day and after bowel movements.  Continue to do this until all pain is resolved.  ?Over the counter pain medication ?Prescription pain medication ?Most patients will experience some swelling and discomfort in the anus/rectal area and incisions.  Heat such as warm towels, sitz baths, warm baths, etc to help relax tight/sore spots and speed recovery.  Some people prefer to use ice, especially in the first couple days after surgery, as it may decrease the pain and swelling, or alternate between ice & heat.  Experiment to what works for you.  Swelling and bruising can take several weeks to resolve.  Pain can take even longer to completely resolve. ?It is helpful to take an over-the-counter pain medication regularly for the first few weeks.  Choose one of the following that works best for you: ?Naproxen (Aleve, etc)  Two 220mg  tabs twice a day ?Ibuprofen (Advil, etc) Three 200mg  tabs four times a day (every meal & bedtime) ?A  prescription for pain medication (such as percocet, oxycodone, hydrocodone, etc) should be  given to you upon discharge.  Take your pain medication as prescribed.  ?If you are having problems/concerns with the prescription medicine (does not control pain, nausea, vomiting, rash, itching, etc), please call us [phone] to see if we need to switch you to a different pain medicine that will work better for you and/or control your side effect better. ?If you need a refill on your pain medication, please contact your pharmacy.  They will contact our office to request authorization. Prescriptions will not be filled after 5 pm or on week-ends. ?KEEP YOUR BOWELS REGULAR and AVOID CONSTIPATION ?The goal is one to two soft bowel movements a day.  You should at least have a bowel movement every other day. ?Avoid getting constipated.  Between the surgery and the pain medications, it is common to experience some constipation. This can be very painful after rectal surgery.  Increasing fluid intake and taking a fiber supplement (such as Metamucil, Citrucel, FiberCon, etc) 1-2 times a day regularly will usually help prevent this problem from occurring.  A stool softener like colace is also recommended.  This can be purchased over the counter at your pharmacy.  You can take it up to 3 times a day.  If you do not have a bowel movement after 24 hrs since your surgery, take one does of milk of magnesia.  If you still haven't had a bowel movement 8-12 hours after that dose, take another dose.  If you don't have a bowel movement 48 hrs after  surgery, purchase a Fleets enema from the drug store and administer gently per package instructions.  If you still are having trouble with your bowel movements after that, please call the office for further instructions. ?If you develop diarrhea or have many loose bowel movements, simplify your diet to bland foods & liquids for a few days.  Stop any stool softeners and decrease your fiber supplement.  Switching to mild anti-diarrheal medications (Kayopectate, Pepto Bismol) can help.   If this worsens or does not improve, please call us. ? ?Wound Care ?Remove your bandages before your first bowel movement or 8 hours after surgery.     ?Remove any wound packing material at this tim,e as well.  You do not need to repack the wound unless instructed otherwise.  Wear an absorbent pad or soft cotton gauze in your underwear to catch any drainage and help keep the area clean. You should change this every 2-3 hours while awake. ?Keep the area clean and dry.  Bathe / shower every day, especially after bowel movements.  Keep the area clean by showering / bathing over the incision / wound.   It is okay to soak an open wound to help wash it.  Wet wipes or showers / gentle washing after bowel movements is often less traumatic than regular toilet paper. ?You may have some styrofoam-like soft packing in the rectum which will come out with the first bowel movement.  ?You will often notice bleeding with bowel movements.  This should slow down by the end of the first week of surgery ?Expect some drainage.  This should slow down, too, by the end of the first week of surgery.  Wear an absorbent pad or soft cotton gauze in your underwear until the drainage stops. ?Do Not sit on a rubber or pillow ring.  This can make you symptoms worse.  You may sit on a soft pillow if needed.  ?ACTIVITIES as tolerated:   ?You may resume regular (light) daily activities beginning the next day--such as daily self-care, walking, climbing stairs--gradually increasing activities as tolerated.  If you can walk 30 minutes without difficulty, it is safe to try more intense activity such as jogging, treadmill, bicycling, low-impact aerobics, swimming, etc. ?Save the most intensive and strenuous activity for last such as sit-ups, heavy lifting, contact sports, etc  Refrain from any heavy lifting or straining until you are off narcotics for pain control.   ?You may drive when you are no longer taking prescription pain medication, you can  comfortably sit for long periods of time, and you can safely maneuver your car and apply brakes. ?You may have sexual intercourse when it is comfortable.  ?FOLLOW UP in our office ?Please call [facility] at [phone] to set up an appointment to see your surgeon in the office for a follow-up appointment approximately 3-4 weeks after your surgery. ?Make sure that you call for this appointment the day you arrive home to insure a convenient appointment time. ?10. IF YOU HAVE DISABILITY OR FAMILY LEAVE FORMS, BRING THEM TO THE OFFICE FOR PROCESSING.  DO NOT GIVE THEM TO YOUR DOCTOR. ? ? ? ? ?WHEN TO CALL us [phone]: ?Poor pain control ?Reactions / problems with new medications (rash/itching, nausea, etc)  ?Fever over 101.5 F (38.5 C) ?Inability to urinate ?Nausea and/or vomiting ?Worsening swelling or bruising ?Continued bleeding from incision. ?Increased pain, redness, or drainage from the incision ? ?The clinic staff is available to answer your questions during regular business hours (8:30am-5pm).  Please don?t hesitate to call and ask to speak to one of our nurses for clinical concerns.   A surgeon from [facility] is always on call at the hospitals ?  ?If you have a medical emergency, go to the nearest emergency room or call 911. ?  ? ?[facility], [place] ?[address], [place], [place], [place]  [address] ? ?MAIN: [phone] ? TOLL FREE: [phone] ? ?FAX [phone] ?[email or web address] ? ? ?Post Anesthesia Home Care Instructions ? ?Activity: ?Get plenty of rest for the remainder of the day. A responsible individual must stay with you for 24 hours following the procedure.  ?For the next 24 hours, DO NOT: ?-Drive a car ?-Paediatric nurse ?-Drink alcoholic beverages ?-Take any medication unless instructed by your physician ?-Make any legal decisions or sign important papers. ? ?Meals: ?Start with liquid foods such as gelatin or soup. Progress to regular foods  as tolerated. Avoid greasy, spicy, heavy foods. If nausea and/or vomiting occur, drink only clear liquids until the nausea and/or vomiting subsides. Call your physician if vomiting continues. ? ?Special Instru

## 2021-11-15 NOTE — Op Note (Signed)
11/15/2021 ? ?12:53 PM ? ?PATIENT:  Breanna Dunn  41 y.o. female ? ?Patient Care Team: ?Glenis Smoker, MD as PCP - General (Family Medicine) ? ?PRE-OPERATIVE DIAGNOSIS:  ANAL SKIN TAG ? ?POST-OPERATIVE DIAGNOSIS:  ANAL SKIN TAG ? ?PROCEDURE:  EXCISION OF ANAL SKIN TAG ? ? Surgeon(s): ?Leighton Ruff, MD ? ?ASSISTANT: Vernell Leep, MD  ? ?ANESTHESIA:   local and general ? ?SPECIMEN:  Source of Specimen:  anterior anal skin tag ? ?DISPOSITION OF SPECIMEN:  PATHOLOGY ? ?COUNTS:  YES ? ?PLAN OF CARE: Discharge to home after PACU ? ?PATIENT DISPOSITION:  PACU - hemodynamically stable. ? ?INDICATION: chronically inflamed anterior skin tag ? ? ?OR FINDINGS: anterior skin tag ? ?DESCRIPTION: the patient was identified in the preoperative holding area and taken to the OR where they were laid on the operating room table.  The patient was then positioned in prone jackknife position and MAC anesthesia was commenced.  Shortly after anesthesia started the patient began to have trouble with secretions.  The decision was made to switch to a lithotomy position with intubation.  Once this was complete , the buttocks were gently taped apart.  The patient was then prepped and draped in usual sterile fashion.  SCDs were noted to be in place prior to the initiation of anesthesia. A surgical timeout was performed indicating the correct patient, procedure, positioning and need for preoperative antibiotics.  A rectal block was performed using Marcaine with epinephrine.   ? ?I began with a digital rectal exam.  Rectal tone was good I then placed a Hill-Ferguson anoscope into the anal canal and evaluated this completely.  There was minimal hemorrhoid disease.  We incised the skin tag using Metzenbaum scissors.  The incision was then closed using interrupted 2-0 and 3-0 chromic sutures.  Hemostasis was good.  Additional Marcaine and a sterile dressing were applied.  The patient was then awakened from anesthesia and sent to the  postanesthesia care unit in stable condition.  I was personally present during the key and critical portions of this procedure and immediately available throughout the entire procedure, as documented in my operative note.  All counts were correct per operating room staff. ? ?Rosario Adie, MD ? ?Colorectal and General Surgery ?[facility]     ?

## 2021-11-15 NOTE — Addendum Note (Signed)
Addendum  created 11/15/21 1400 by Lannie Fields, DO  ? Clinical Note Signed  ?  ?

## 2021-11-15 NOTE — Transfer of Care (Signed)
Immediate Anesthesia Transfer of Care Note ? ?Patient: Breanna Dunn ? ?Procedure(s) Performed: EXCISION OF ANAL SKIN TAG (Anus) ? ?Patient Location: PACU ? ?Anesthesia Type:MAC and General ? ?Level of Consciousness: awake, alert  and oriented ? ?Airway & Oxygen Therapy: Patient Spontanous Breathing ? ?Post-op Assessment: Report given to RN and Post -op Vital signs reviewed and stable ? ?Post vital signs: Reviewed and stable ? ?Last Vitals:  ?Vitals Value Taken Time  ?BP 146/74 11/15/21 1305  ?Temp 36.3 ?C 11/15/21 1304  ?Pulse 110 11/15/21 1308  ?Resp 12 11/15/21 1312  ?SpO2 96 % 11/15/21 1308  ?Vitals shown include unvalidated device data. ? ?Last Pain:  ?Vitals:  ? 11/15/21 1304  ?TempSrc:   ?PainSc: 4   ?   ? ?Patients Stated Pain Goal: 5 (11/15/21 1304) ? ?Complications: No notable events documented. ?

## 2021-11-15 NOTE — Anesthesia Postprocedure Evaluation (Addendum)
Anesthesia Post Note ? ?Patient: Breanna Dunn ? ?Procedure(s) Performed: EXCISION OF ANAL SKIN TAG (Anus) ? ?  ? ?Patient location during evaluation: PACU ?Anesthesia Type: General ?Level of consciousness: awake and alert ?Pain management: pain level controlled ?Vital Signs Assessment: post-procedure vital signs reviewed and stable ?Respiratory status: spontaneous breathing, nonlabored ventilation and respiratory function stable ?Cardiovascular status: blood pressure returned to baseline and stable ?Postop Assessment: no apparent nausea or vomiting ?Anesthetic complications: no ?Comments: Converted from MAC to GA/ETT d/t copious secretions, mild laryngospasm. ? ? ?No notable events documented. ? ?Last Vitals:  ?Vitals:  ? 11/15/21 1315 11/15/21 1330  ?BP: (!) 144/83 (!) 143/84  ?Pulse:    ?Resp: 13 13  ?Temp:    ?SpO2:    ?  ?Last Pain:  ?Vitals:  ? 11/15/21 1304  ?TempSrc:   ?PainSc: 4   ? ? ?  ?  ?  ?  ?  ?  ? ?Jarome Matin Geryl Dohn ? ? ? ? ?

## 2021-11-18 ENCOUNTER — Encounter (HOSPITAL_BASED_OUTPATIENT_CLINIC_OR_DEPARTMENT_OTHER): Payer: Self-pay | Admitting: General Surgery

## 2021-11-18 LAB — SURGICAL PATHOLOGY

## 2021-11-20 ENCOUNTER — Other Ambulatory Visit (HOSPITAL_COMMUNITY): Payer: Self-pay | Admitting: General Surgery

## 2021-11-20 DIAGNOSIS — G8918 Other acute postprocedural pain: Secondary | ICD-10-CM

## 2021-11-20 MED ORDER — OXYCODONE HCL 5 MG PO TABS: 5.0000 mg | ORAL_TABLET | Freq: Four times a day (QID) | ORAL | 0 refills | Status: DC | PRN

## 2021-12-27 DIAGNOSIS — L509 Urticaria, unspecified: Secondary | ICD-10-CM | POA: Diagnosis not present

## 2022-02-03 DIAGNOSIS — N870 Mild cervical dysplasia: Secondary | ICD-10-CM | POA: Diagnosis not present

## 2022-02-03 DIAGNOSIS — Z01419 Encounter for gynecological examination (general) (routine) without abnormal findings: Secondary | ICD-10-CM | POA: Diagnosis not present

## 2022-02-03 DIAGNOSIS — Z6838 Body mass index (BMI) 38.0-38.9, adult: Secondary | ICD-10-CM | POA: Diagnosis not present

## 2022-02-03 DIAGNOSIS — Z1231 Encounter for screening mammogram for malignant neoplasm of breast: Secondary | ICD-10-CM | POA: Diagnosis not present

## 2022-02-26 DIAGNOSIS — F4323 Adjustment disorder with mixed anxiety and depressed mood: Secondary | ICD-10-CM | POA: Diagnosis not present

## 2022-02-26 DIAGNOSIS — Z6379 Other stressful life events affecting family and household: Secondary | ICD-10-CM | POA: Diagnosis not present

## 2022-03-24 DIAGNOSIS — Z0000 Encounter for general adult medical examination without abnormal findings: Secondary | ICD-10-CM | POA: Diagnosis not present

## 2022-03-24 DIAGNOSIS — E782 Mixed hyperlipidemia: Secondary | ICD-10-CM | POA: Diagnosis not present

## 2022-03-24 DIAGNOSIS — R5383 Other fatigue: Secondary | ICD-10-CM | POA: Diagnosis not present

## 2022-03-25 ENCOUNTER — Telehealth: Payer: Self-pay | Admitting: Licensed Clinical Social Worker

## 2022-03-25 NOTE — Telephone Encounter (Signed)
Scheduled appt per 7/25 referral. Pt is aware of appt date and time. Pt is aware to arrive 15 mins prior to appt time and to bring and updated insurance card. Pt is aware of appt location.   

## 2022-03-26 DIAGNOSIS — Z62811 Personal history of psychological abuse in childhood: Secondary | ICD-10-CM | POA: Diagnosis not present

## 2022-03-26 DIAGNOSIS — F4323 Adjustment disorder with mixed anxiety and depressed mood: Secondary | ICD-10-CM | POA: Diagnosis not present

## 2022-03-26 DIAGNOSIS — Z630 Problems in relationship with spouse or partner: Secondary | ICD-10-CM | POA: Diagnosis not present

## 2022-04-09 ENCOUNTER — Other Ambulatory Visit: Payer: Self-pay | Admitting: Obstetrics & Gynecology

## 2022-04-09 DIAGNOSIS — N62 Hypertrophy of breast: Secondary | ICD-10-CM

## 2022-05-08 ENCOUNTER — Encounter (INDEPENDENT_AMBULATORY_CARE_PROVIDER_SITE_OTHER): Payer: Managed Care, Other (non HMO) | Admitting: Family Medicine

## 2022-05-08 DIAGNOSIS — Z0289 Encounter for other administrative examinations: Secondary | ICD-10-CM

## 2022-05-15 DIAGNOSIS — F4323 Adjustment disorder with mixed anxiety and depressed mood: Secondary | ICD-10-CM | POA: Diagnosis not present

## 2022-05-15 DIAGNOSIS — Z62819 Personal history of unspecified abuse in childhood: Secondary | ICD-10-CM | POA: Diagnosis not present

## 2022-05-19 ENCOUNTER — Inpatient Hospital Stay: Payer: Managed Care, Other (non HMO) | Admitting: Licensed Clinical Social Worker

## 2022-05-19 ENCOUNTER — Inpatient Hospital Stay: Payer: Managed Care, Other (non HMO)

## 2022-05-29 ENCOUNTER — Ambulatory Visit (INDEPENDENT_AMBULATORY_CARE_PROVIDER_SITE_OTHER): Payer: Managed Care, Other (non HMO) | Admitting: Family Medicine

## 2022-06-11 DIAGNOSIS — F4320 Adjustment disorder, unspecified: Secondary | ICD-10-CM | POA: Diagnosis not present

## 2022-06-11 DIAGNOSIS — Z62819 Personal history of unspecified abuse in childhood: Secondary | ICD-10-CM | POA: Diagnosis not present

## 2022-06-12 ENCOUNTER — Encounter (INDEPENDENT_AMBULATORY_CARE_PROVIDER_SITE_OTHER): Payer: Self-pay | Admitting: Family Medicine

## 2022-06-12 ENCOUNTER — Ambulatory Visit (INDEPENDENT_AMBULATORY_CARE_PROVIDER_SITE_OTHER): Payer: BC Managed Care – PPO | Admitting: Family Medicine

## 2022-06-12 ENCOUNTER — Ambulatory Visit (INDEPENDENT_AMBULATORY_CARE_PROVIDER_SITE_OTHER): Payer: Managed Care, Other (non HMO) | Admitting: Family Medicine

## 2022-06-12 VITALS — BP 128/85 | HR 88 | Temp 98.4°F | Ht 62.0 in | Wt 216.0 lb

## 2022-06-12 DIAGNOSIS — R5383 Other fatigue: Secondary | ICD-10-CM

## 2022-06-12 DIAGNOSIS — E669 Obesity, unspecified: Secondary | ICD-10-CM | POA: Diagnosis not present

## 2022-06-12 DIAGNOSIS — Z6839 Body mass index (BMI) 39.0-39.9, adult: Secondary | ICD-10-CM

## 2022-06-12 DIAGNOSIS — F32A Depression, unspecified: Secondary | ICD-10-CM

## 2022-06-12 DIAGNOSIS — E559 Vitamin D deficiency, unspecified: Secondary | ICD-10-CM | POA: Diagnosis not present

## 2022-06-12 DIAGNOSIS — R0602 Shortness of breath: Secondary | ICD-10-CM | POA: Diagnosis not present

## 2022-06-12 DIAGNOSIS — F419 Anxiety disorder, unspecified: Secondary | ICD-10-CM

## 2022-06-17 LAB — COMPREHENSIVE METABOLIC PANEL
ALT: 16 IU/L (ref 0–32)
AST: 11 IU/L (ref 0–40)
Albumin/Globulin Ratio: 1.7 (ref 1.2–2.2)
Albumin: 4.4 g/dL (ref 3.9–4.9)
Alkaline Phosphatase: 61 IU/L (ref 44–121)
BUN/Creatinine Ratio: 17 (ref 9–23)
BUN: 16 mg/dL (ref 6–24)
Bilirubin Total: <0.2 mg/dL (ref 0.0–1.2)
CO2: 22 mmol/L (ref 20–29)
Calcium: 9.6 mg/dL (ref 8.7–10.2)
Chloride: 104 mmol/L (ref 96–106)
Creatinine, Ser: 0.92 mg/dL (ref 0.57–1.00)
Globulin, Total: 2.6 g/dL (ref 1.5–4.5)
Glucose: 80 mg/dL (ref 70–99)
Potassium: 4.7 mmol/L (ref 3.5–5.2)
Sodium: 140 mmol/L (ref 134–144)
Total Protein: 7 g/dL (ref 6.0–8.5)
eGFR: 79 mL/min/{1.73_m2} (ref >59)

## 2022-06-17 LAB — LIPID PANEL
Chol/HDL Ratio: 3.5 ratio (ref 0.0–4.4)
Cholesterol, Total: 236 mg/dL — ABNORMAL HIGH (ref 100–199)
HDL: 67 mg/dL (ref >39)
LDL Chol Calc (NIH): 148 mg/dL — ABNORMAL HIGH (ref 0–99)
Triglycerides: 119 mg/dL (ref 0–149)
VLDL Cholesterol Cal: 21 mg/dL (ref 5–40)

## 2022-06-17 LAB — CBC WITH DIFFERENTIAL/PLATELET
Basophils Absolute: 0 10*3/uL (ref 0.0–0.2)
Basos: 0 %
EOS (ABSOLUTE): 0.1 10*3/uL (ref 0.0–0.4)
Eos: 2 %
Hematocrit: 38.5 % (ref 34.0–46.6)
Hemoglobin: 12.8 g/dL (ref 11.1–15.9)
Immature Grans (Abs): 0 10*3/uL (ref 0.0–0.1)
Immature Granulocytes: 0 %
Lymphocytes Absolute: 1.3 10*3/uL (ref 0.7–3.1)
Lymphs: 22 %
MCH: 30 pg (ref 26.6–33.0)
MCHC: 33.2 g/dL (ref 31.5–35.7)
MCV: 90 fL (ref 79–97)
Monocytes Absolute: 0.4 10*3/uL (ref 0.1–0.9)
Monocytes: 7 %
Neutrophils Absolute: 4 10*3/uL (ref 1.4–7.0)
Neutrophils: 69 %
Platelets: 309 10*3/uL (ref 150–450)
RBC: 4.26 x10E6/uL (ref 3.77–5.28)
RDW: 12.2 % (ref 11.7–15.4)
WBC: 5.8 10*3/uL (ref 3.4–10.8)

## 2022-06-17 LAB — TSH: TSH: 1.91 u[IU]/mL (ref 0.450–4.500)

## 2022-06-17 LAB — VITAMIN D 25 HYDROXY (VIT D DEFICIENCY, FRACTURES): Vit D, 25-Hydroxy: 25.7 ng/mL — ABNORMAL LOW (ref 30.0–100.0)

## 2022-06-17 LAB — T4, FREE: Free T4: 1.1 ng/dL (ref 0.82–1.77)

## 2022-06-17 LAB — VITAMIN B12: Vitamin B-12: 362 pg/mL (ref 232–1245)

## 2022-06-17 LAB — HEMOGLOBIN A1C
Est. average glucose Bld gHb Est-mCnc: 111 mg/dL
Hgb A1c MFr Bld: 5.5 % (ref 4.8–5.6)

## 2022-06-17 LAB — INSULIN, RANDOM: INSULIN: 19.2 u[IU]/mL (ref 2.6–24.9)

## 2022-06-23 DIAGNOSIS — N870 Mild cervical dysplasia: Secondary | ICD-10-CM | POA: Diagnosis not present

## 2022-06-23 NOTE — Progress Notes (Signed)
Chief Complaint:   OBESITY Breanna Dunn (MR# [id number]) is a 41 y.o. female who presents for evaluation and treatment of obesity and related comorbidities. Current BMI is Body mass index is 39.51 kg/m. Breanna Dunn has been struggling with her weight for many years and has been unsuccessful in either losing weight, maintaining weight loss, or reaching her healthy weight goal.  She has slowly gained weight in the past 20 years.  She has not been able to get <200 lbs. She works a sedentary job and shares custody of her 5 and 6 year children.  She has never been on anti-obesity medications.  She usually eats 3 meals per day.  Boredom snacker at home.  She loves sweets, bread, pasta.  Her portions are okay.  She drinks coffee, coke zero, water.  Time is a barrier to exercise.  Liked walking in the spring.   Breanna Dunn is currently in the action stage of change and ready to dedicate time achieving and maintaining a healthier weight. Breanna Dunn is interested in becoming our patient and working on intensive lifestyle modifications including (but not limited to) diet and exercise for weight loss.  Breanna Dunn's habits were reviewed today and are as follows: Her family eats meals together, her desired weight loss is 66 lbs, she has been heavy most of her life, she started gaining weight about 7-8 years ago, her heaviest weight ever was 230 pounds, she is a picky eater and doesn't like to eat healthier foods, she has significant food cravings issues, she snacks frequently in the evenings, she skips meals frequently, she is frequently drinking liquids with calories, she frequently makes poor food choices, she frequently eats larger portions than normal, and she struggles with emotional eating.  Depression Screen Breanna Dunn's Food and Mood (modified PHQ-9) score was 11.     06/12/2022    8:22 AM  Depression screen PHQ 2/9  Decreased Interest 1  Down, Depressed, Hopeless 2  PHQ - 2 Score 3  Altered  sleeping 2  Tired, decreased energy 2  Change in appetite 2  Feeling bad or failure about yourself  2  Trouble concentrating 0  Moving slowly or fidgety/restless 0  Suicidal thoughts 0  PHQ-9 Score 11  Difficult doing work/chores Not difficult at all   Subjective:   1. Other fatigue Reviewed EKG and IC results.  Breanna Dunn admits to daytime somnolence and admits to waking up still tired. Patient has a history of symptoms of daytime fatigue, morning fatigue, and morning headache. Breanna Dunn generally gets 6 or 7 hours of sleep per night, and states that she has nightime awakenings. Snoring is present. Apneic episodes are not present. Epworth Sleepiness Score is 5.   2. SOBOE (shortness of breath on exertion) Marcelino Duster notes increasing shortness of breath with exercising and seems to be worsening over time with weight gain. She notes getting out of breath sooner with activity than she used to. This has not gotten worse recently. Brook denies shortness of breath at rest or orthopnea.  3. Vitamin D deficiency She is off her Vitamin D supplement, but she complains of fatigue.    4. Anxiety and depression She is on fluoxetine 10 mg daily.  She reports improving mood.  PHQ9:11.   Assessment/Plan:   1. Other fatigue Breanna Dunn does feel that her weight is causing her energy to be lower than it should be. Fatigue may be related to obesity, depression or many other causes. Labs will be ordered, and in the meanwhile, Breanna Dunn will  focus on self care including making healthy food choices, increasing physical activity and focusing on stress reduction.  Update labs today.   - EKG 12-Lead - TSH - T4, free - Lipid panel - Insulin, random - Hemoglobin A1c - Comprehensive metabolic panel - Vitamin K93 - CBC with Differential/Platelet  2. SOBOE (shortness of breath on exertion) Breanna Dunn does feel that she gets out of breath more easily that she used to when she exercises. [name]'s shortness of  breath appears to be obesity related and exercise induced. She has agreed to work on weight loss and gradually increase exercise to treat her exercise induced shortness of breath. Will continue to monitor closely.  3. Vitamin D deficiency Check Vitamin D level and labs today.   - VITAMIN D 25 Hydroxy (Vit-D Deficiency, Fractures)  4. Anxiety and depression Breanna Dunn had a positive depression screening. Depression is commonly associated with obesity and often results in emotional eating behaviors. We will monitor this closely and work on CBT to help improve the non-hunger eating patterns. Referral to Psychology may be required if no improvement is seen as she continues in our clinic.  5. Obesity,current BMI 39.6 Breanna Dunn is currently in the action stage of change and her goal is to continue with weight loss efforts. I recommend Breanna Dunn begin the structured treatment plan as follows:  She has agreed to the Category 1 Plan.  Exercise goals:  she is getting a treadmill.     Behavioral modification strategies: increasing lean protein intake, increasing vegetables, increasing water intake, decreasing eating out, no skipping meals, meal planning and cooking strategies, planning for success, and decreasing junk food.  She was informed of the importance of frequent follow-up visits to maximize her success with intensive lifestyle modifications for her multiple health conditions. She was informed we would discuss her lab results at her next visit unless there is a critical issue that needs to be addressed sooner. Breanna Dunn agreed to keep her next visit at the agreed upon time to discuss these results.  Objective:   Blood pressure 128/85, pulse 88, temperature 98.4 F (36.9 C), height 5\' 2"  (1.575 m), weight 216 lb (98 kg), SpO2 98 %. Body mass index is 39.51 kg/m.  EKG: Normal sinus rhythm, rate 84 BPM.  Indirect Calorimeter completed today shows a VO2 of 199 and a REE of 1368.  Her calculated  basal metabolic rate is 2671 thus her basal metabolic rate is worse than expected.  General: Cooperative, alert, well developed, in no acute distress. HEENT: Conjunctivae and lids unremarkable. Cardiovascular: Regular rhythm.  Lungs: Normal work of breathing. Neurologic: No focal deficits.   Lab Results  Component Value Date   CREATININE 0.92 06/12/2022   BUN 16 06/12/2022   NA 140 06/12/2022   K 4.7 06/12/2022   CL 104 06/12/2022   CO2 22 06/12/2022   Lab Results  Component Value Date   ALT 16 06/12/2022   AST 11 06/12/2022   ALKPHOS 61 06/12/2022   BILITOT <0.2 06/12/2022   Lab Results  Component Value Date   HGBA1C 5.5 06/12/2022   Lab Results  Component Value Date   INSULIN 19.2 06/12/2022   Lab Results  Component Value Date   TSH 1.910 06/12/2022   Lab Results  Component Value Date   CHOL 236 (H) 06/12/2022   HDL 67 06/12/2022   LDLCALC 148 (H) 06/12/2022   TRIG 119 06/12/2022   CHOLHDL 3.5 06/12/2022   Lab Results  Component Value Date   WBC 5.8 06/12/2022  HGB 12.8 06/12/2022   HCT 38.5 06/12/2022   MCV 90 06/12/2022   PLT 309 06/12/2022   No results found for: "IRON", "TIBC", "FERRITIN"  Attestation Statements:   Reviewed by clinician on day of visit: allergies, medications, problem list, medical history, surgical history, family history, social history, and previous encounter notes.  I, Malcolm Metro, am acting as Energy manager for [name], DO.  I have reviewed the above documentation for accuracy and completeness, and I agree with the above. Glennis Brink, DO

## 2022-06-25 ENCOUNTER — Ambulatory Visit (INDEPENDENT_AMBULATORY_CARE_PROVIDER_SITE_OTHER): Payer: BC Managed Care – PPO | Admitting: Family Medicine

## 2022-06-25 ENCOUNTER — Encounter (INDEPENDENT_AMBULATORY_CARE_PROVIDER_SITE_OTHER): Payer: Self-pay | Admitting: Family Medicine

## 2022-06-25 VITALS — BP 135/89 | HR 91 | Temp 98.1°F | Ht 62.0 in | Wt 216.0 lb

## 2022-06-25 DIAGNOSIS — Z6839 Body mass index (BMI) 39.0-39.9, adult: Secondary | ICD-10-CM

## 2022-06-25 DIAGNOSIS — E669 Obesity, unspecified: Secondary | ICD-10-CM | POA: Diagnosis not present

## 2022-06-25 DIAGNOSIS — E88819 Insulin resistance, unspecified: Secondary | ICD-10-CM | POA: Diagnosis not present

## 2022-06-25 DIAGNOSIS — E7849 Other hyperlipidemia: Secondary | ICD-10-CM

## 2022-06-25 DIAGNOSIS — E559 Vitamin D deficiency, unspecified: Secondary | ICD-10-CM | POA: Diagnosis not present

## 2022-06-25 MED ORDER — VITAMIN D (ERGOCALCIFEROL) 1.25 MG (50000 UNIT) PO CAPS: 50000.0000 [IU] | ORAL_CAPSULE | ORAL | 0 refills | Status: DC

## 2022-07-07 ENCOUNTER — Other Ambulatory Visit: Payer: Self-pay | Admitting: Licensed Clinical Social Worker

## 2022-07-07 ENCOUNTER — Encounter: Payer: Self-pay | Admitting: Licensed Clinical Social Worker

## 2022-07-07 ENCOUNTER — Inpatient Hospital Stay: Payer: Managed Care, Other (non HMO)

## 2022-07-07 ENCOUNTER — Inpatient Hospital Stay: Payer: Managed Care, Other (non HMO) | Attending: Genetic Counselor | Admitting: Licensed Clinical Social Worker

## 2022-07-07 DIAGNOSIS — Z803 Family history of malignant neoplasm of breast: Secondary | ICD-10-CM | POA: Diagnosis not present

## 2022-07-07 DIAGNOSIS — Z809 Family history of malignant neoplasm, unspecified: Secondary | ICD-10-CM

## 2022-07-07 DIAGNOSIS — Z8049 Family history of malignant neoplasm of other genital organs: Secondary | ICD-10-CM

## 2022-07-07 DIAGNOSIS — Z801 Family history of malignant neoplasm of trachea, bronchus and lung: Secondary | ICD-10-CM

## 2022-07-07 DIAGNOSIS — Z8042 Family history of malignant neoplasm of prostate: Secondary | ICD-10-CM

## 2022-07-07 DIAGNOSIS — Z800 Family history of malignant neoplasm of digestive organs: Secondary | ICD-10-CM | POA: Diagnosis not present

## 2022-07-07 LAB — GENETIC SCREENING ORDER

## 2022-07-07 NOTE — Progress Notes (Signed)
REFERRING PROVIDER: Glenis Smoker, MD [place],  [place] [id number]  PRIMARY PROVIDER:  Glenis Smoker, MD  PRIMARY REASON FOR VISIT:  1. Family history of uterine cancer   2. Family history of colon cancer   3. Family history of stomach cancer   4. Family history of breast cancer   5. Family history of prostate cancer   6. Family history of lung cancer      HISTORY OF PRESENT ILLNESS:   [name], a 41 y.o. female, was seen for a Breanna Dunn cancer genetics consultation at the request of Dr. Lindell Noe due to a family history of cancer.  [name] presents to clinic today to discuss the possibility of a hereditary predisposition to cancer, genetic testing, and to further clarify her future cancer risks, as well as potential cancer risks for family members.    CANCER HISTORY:  [name] is a 41 y.o. female with no personal history of cancer.    RISK FACTORS:  Menarche was at age 65.  First live birth at age 62.  OCP use for approximately  17  years.  Ovaries intact: yes.  Hysterectomy: no.  Menopausal status: premenopausal.  HRT use: 0 years. Colonoscopy: yes;  2020; 2 polyps removed . Mammogram within the last year: yes; also has breast MRI Up to date with pelvic exams: yes.  Past Medical History:  Diagnosis Date   Anal skin tag    Anxiety    B12 deficiency    Gallbladder disease    gallbladder removal   Hemorrhoids    High cholesterol    History of anemia    History of COVID-19 09/2020   per pt mild symptoms that resolved   Vitamin D deficiency    Wears contact lenses     Past Surgical History:  Procedure Laterality Date   BREAST BIOPSY Right 10/10/2021   COLONOSCOPY  2020   EXCISION OF SKIN TAG N/A 11/15/2021   Procedure: EXCISION OF ANAL SKIN TAG;  Surgeon: Leighton Ruff, MD;  Location: [facility];  Service: General;  Laterality: N/A;   LAPAROSCOPIC CHOLECYSTECTOMY  2012   WISDOM TOOTH EXTRACTION      age 86s    FAMILY HISTORY:  We obtained a detailed, 4-generation family history.  Significant diagnoses are listed below: Family History  Problem Relation Age of Onset   Uterine cancer Mother        + cervical dx 21   Multiple sclerosis Mother    Colon cancer Mother        + stomach, dx 35s   Healthy Father    Depression Father    Bipolar disorder Father    Breast cancer Maternal Grandmother        dx 23s   Melanoma Maternal Grandmother    Prostate cancer Maternal Grandfather        metastatic; d. 41s   Lung cancer Paternal Grandfather    [name] has 1 son, 6 and 1 daughter, 5. She has 2 brothers, no cancers.  [name] mother had several cancers, uterine and cervical at age 64 (possible ovarian?) and colon/stomach cancer in her 61s. She passed at 95. Maternal grandmother had breast cancer in her 83s, melanoma on her nose, and had brothers who had cancer. Maternal grandfather had metastatic prostate cancer and passed in his 4s.  [name]'s father passed at 26. She has limited information about this side of the family. The only cancer she is aware  of is her grandfather who was a heavy smoker and had lung cancer.   [name] is unaware of previous family history of genetic testing for hereditary cancer risks. There is no reported Ashkenazi Jewish ancestry. There is no known consanguinity.    GENETIC COUNSELING ASSESSMENT: [name] is a 41 y.o. female with a family history of early onset cancers which is somewhat suggestive of a hereditary cancer syndrome and predisposition to cancer. We, therefore, discussed and recommended the following at today's visit.   DISCUSSION: We discussed that approximately 10% of cancer is hereditary. Most cases of hereditary uterine/colon cancer are associated with Lynch syndrome genes, although there are other genes associated with hereditary cancer as well. We discussed BRCA1/2 given the family history of prostate and breast in  her grandparents. Cancers and risks are gene specific. We discussed that testing is beneficial for several reasons including knowing about cancer risks, identifying potential screening and risk-reduction options that may be appropriate, and to understand if other family members could be at risk for cancer and allow them to undergo genetic testing.   We reviewed the characteristics, features and inheritance patterns of hereditary cancer syndromes. We also discussed genetic testing, including the appropriate family members to test, the process of testing, insurance coverage and turn-around-time for results. We discussed the implications of a negative, positive and/or variant of uncertain significant result. We recommended [name] pursue genetic testing for the [facility] CancerNext-Expanded+RNA gene panel.   Based on [name]'s family history of cancer, she meets medical criteria for genetic testing. Despite that she meets criteria, she may still have an out of pocket cost. We discussed that if her out of pocket cost for testing is over $100, the laboratory will call and confirm whether she wants to proceed with testing.  If the out of pocket cost of testing is less than $100 she will be billed by the genetic testing laboratory.   PLAN: After considering the risks, benefits, and limitations, [name] provided informed consent to pursue genetic testing and the blood sample was sent to [facility] for analysis of the CancerNext-Expanded+RNA panel. Results should be available within approximately 2-3 weeks' time, at which point they will be disclosed by telephone to [name], as will any additional recommendations warranted by these results. [name] will receive a summary of her genetic counseling visit and a copy of her results once available. This information will also be available in Epic.   [name] questions were answered to her satisfaction today. Our contact information was  provided should additional questions or concerns arise. Thank you for the referral and allowing [place] to share in the care of your patient.   Faith Rogue, MS, [facility] Genetic Counselor [email or web address] Phone: [phone]  The patient was seen for a total of 25 minutes in face-to-face genetic counseling.  Dr. Grayland Ormond was available for discussion regarding this case.   _______________________________________________________________________ For Office Staff:  Number of people involved in session: 1 Was an Intern/ student involved with case: no

## 2022-07-11 DIAGNOSIS — Z62819 Personal history of unspecified abuse in childhood: Secondary | ICD-10-CM | POA: Diagnosis not present

## 2022-07-11 DIAGNOSIS — F4320 Adjustment disorder, unspecified: Secondary | ICD-10-CM | POA: Diagnosis not present

## 2022-07-14 NOTE — Progress Notes (Signed)
Chief Complaint:   OBESITY Reighlyn is here to discuss her progress with her obesity treatment plan along with follow-up of her obesity related diagnoses. Shaindel is on the Category 1 Plan and states she is following her eating plan approximately 50% of the time. Ercilia states she is not exercising.   Today's visit was #: 2 Starting weight: 216 lbs Starting date: 06/12/2022 Today's weight: 216 lbs Today's date: 06/25/2022 Total lbs lost to date: 0 Total lbs lost since last in-office visit: 0  Interim History: She is liking some of the foods.  She felt adequately full.  She is not skipping meals.  Has had some catered at work lunches.  Had a few beers with a friend and some cheesecake.  She has increased her water intake.  She is getting in her fruit and veggies.  She meal preps for dinner.  She has some snacks.   Subjective:   1. Vitamin D deficiency Discussed labs with patient today. Vitamin D level 25.7. She is not currently taking a Vitamin D supplement.   2. Other hyperlipidemia Discussed labs with patient today. Total cholesterol 236, LDL 148.  She has a family history of HLD.  She has seen cholesterol improve with weight loss in the past.    3. Insulin resistance Discussed labs with patient today. Fasting insulin elevated at 19.2.  Visceral fat rating elevated at 13.  She has started to reduce the intake of sugar and starch.    Assessment/Plan:   1. Vitamin D deficiency Recheck level in 4 months.   Begin - Vitamin D, Ergocalciferol, (DRISDOL) 1.25 MG (50000 UNIT) CAPS capsule; Take 1 capsule (50,000 Units total) by mouth every 7 (seven) days.  Dispense: 5 capsule; Refill: 0  2. Other hyperlipidemia Continue with heart healthy diet and recheck FLP in 6 months.   3. Insulin resistance Consider use of metformin, insulin resistance.  Handouts given.   4. Obesity,current BMI 39.6 1)  Setting up treadmill. 2)  Plans to add in some outdoor walking.  3)  Set a  goal for >64 oz of water intake daily.   Makenze is currently in the action stage of change. As such, her goal is to continue with weight loss efforts. She has agreed to the Category 1 Plan+90 protein daily.    Exercise goals:  Walking, start on days off of work.   Behavioral modification strategies: increasing lean protein intake, increasing vegetables, increasing water intake, decreasing eating out, no skipping meals, meal planning and cooking strategies, and decreasing junk food.  Joletta has agreed to follow-up with our clinic in 3 weeks. She was informed of the importance of frequent follow-up visits to maximize her success with intensive lifestyle modifications for her multiple health conditions.   Objective:   Blood pressure 135/89, pulse 91, temperature 98.1 F (36.7 C), height 5\' 2"  (1.575 m), weight 216 lb (98 kg), SpO2 98 %. Body mass index is 39.51 kg/m.  General: Cooperative, alert, well developed, in no acute distress. HEENT: Conjunctivae and lids unremarkable. Cardiovascular: Regular rhythm.  Lungs: Normal work of breathing. Neurologic: No focal deficits.   Lab Results  Component Value Date   CREATININE 0.92 06/12/2022   BUN 16 06/12/2022   NA 140 06/12/2022   K 4.7 06/12/2022   CL 104 06/12/2022   CO2 22 06/12/2022   Lab Results  Component Value Date   ALT 16 06/12/2022   AST 11 06/12/2022   ALKPHOS 61 06/12/2022   BILITOT <0.2 06/12/2022  Lab Results  Component Value Date   HGBA1C 5.5 06/12/2022   Lab Results  Component Value Date   INSULIN 19.2 06/12/2022   Lab Results  Component Value Date   TSH 1.910 06/12/2022   Lab Results  Component Value Date   CHOL 236 (H) 06/12/2022   HDL 67 06/12/2022   LDLCALC 148 (H) 06/12/2022   TRIG 119 06/12/2022   CHOLHDL 3.5 06/12/2022   Lab Results  Component Value Date   VD25OH 25.7 (L) 06/12/2022   Lab Results  Component Value Date   WBC 5.8 06/12/2022   HGB 12.8 06/12/2022   HCT 38.5  06/12/2022   MCV 90 06/12/2022   PLT 309 06/12/2022   No results found for: "IRON", "TIBC", "FERRITIN"  Attestation Statements:   Reviewed by clinician on day of visit: allergies, medications, problem list, medical history, surgical history, family history, social history, and previous encounter notes.  I, Malcolm Metro, am acting as Energy manager for [name], DO.  I have reviewed the above documentation for accuracy and completeness, and I agree with the above. Glennis Brink, DO

## 2022-07-21 ENCOUNTER — Encounter (INDEPENDENT_AMBULATORY_CARE_PROVIDER_SITE_OTHER): Payer: Self-pay | Admitting: Family Medicine

## 2022-07-21 ENCOUNTER — Ambulatory Visit (INDEPENDENT_AMBULATORY_CARE_PROVIDER_SITE_OTHER): Payer: BC Managed Care – PPO | Admitting: Family Medicine

## 2022-07-21 VITALS — BP 107/63 | HR 99 | Temp 97.5°F | Ht 62.0 in | Wt 214.0 lb

## 2022-07-21 DIAGNOSIS — E559 Vitamin D deficiency, unspecified: Secondary | ICD-10-CM | POA: Diagnosis not present

## 2022-07-21 DIAGNOSIS — E88819 Insulin resistance, unspecified: Secondary | ICD-10-CM | POA: Diagnosis not present

## 2022-07-21 DIAGNOSIS — E669 Obesity, unspecified: Secondary | ICD-10-CM

## 2022-07-21 DIAGNOSIS — Z6839 Body mass index (BMI) 39.0-39.9, adult: Secondary | ICD-10-CM | POA: Diagnosis not present

## 2022-07-21 MED ORDER — VITAMIN D (ERGOCALCIFEROL) 1.25 MG (50000 UNIT) PO CAPS: 50000.0000 [IU] | ORAL_CAPSULE | ORAL | 0 refills | Status: DC

## 2022-08-04 NOTE — Progress Notes (Unsigned)
Chief Complaint:   OBESITY Breanna Dunn is here to discuss her progress with her obesity treatment plan along with follow-up of her obesity related diagnoses. Breanna Dunn is on the Category 1 Plan and states she is following her eating plan approximately 50% of the time. Breanna Dunn states she is walking 30 minutes 2 times per week.  Today's visit was #: 3 Starting weight: 216 lbs Starting date: 06/12/2022 Today's weight: 214 lbs Today's date: 07/21/2022 Total lbs lost to date: 2 lbs Total lbs lost since last in-office visit: 2 lbs  Interim History: Doing better with food choices.  Denies meal skipping.  Denies overeating sweets.  Getting in fruits and veggies.  Doing great with meal planning and meal prep.  Has sugar cravings.  Added outside walking.  Moved to a bigger house to accommodate her treadmill.  Moving in 2 weeks.   Subjective:   1. Insulin resistance Fasting insulin 19.2, 10/12.  Denies cravings for starches.  Limits access to sweets.   2. Vitamin D deficiency She is currently taking prescription vitamin D 50,000 IU each week. She denies nausea, vomiting or muscle weakness. Last Vitamin D level 25.7 on 10/12.  Assessment/Plan:   1. Insulin resistance Consider use of metformin.  Continue to reduce sugar intake and increase regular exercise.   2. Vitamin D deficiency Refill - Vitamin D, Ergocalciferol, (DRISDOL) 1.25 MG (50000 UNIT) CAPS capsule; Take 1 capsule (50,000 Units total) by mouth every 7 (seven) days.  Dispense: 5 capsule; Refill: 0  3. Obesity,current BMI 39.3 Breanna Dunn is currently in the action stage of change. As such, her goal is to continue with weight loss efforts. She has agreed to the Category 1 Plan.   Exercise goals:  As is.   Behavioral modification strategies: increasing lean protein intake, increasing water intake, decreasing eating out, no skipping meals, meal planning and cooking strategies, keeping healthy foods in the home, emotional eating  strategies, and celebration eating strategies.  Breanna Dunn has agreed to follow-up with our clinic in 3-4 weeks. She was informed of the importance of frequent follow-up visits to maximize her success with intensive lifestyle modifications for her multiple health conditions.   Objective:   Blood pressure 107/63, pulse 99, temperature (!) 97.5 F (36.4 C), height 5\' 2"  (1.575 m), weight 214 lb (97.1 kg), SpO2 100 %. Body mass index is 39.14 kg/m.  General: Cooperative, alert, well developed, in no acute distress. HEENT: Conjunctivae and lids unremarkable. Cardiovascular: Regular rhythm.  Lungs: Normal work of breathing. Neurologic: No focal deficits.   Lab Results  Component Value Date   CREATININE 0.92 06/12/2022   BUN 16 06/12/2022   NA 140 06/12/2022   K 4.7 06/12/2022   CL 104 06/12/2022   CO2 22 06/12/2022   Lab Results  Component Value Date   ALT 16 06/12/2022   AST 11 06/12/2022   ALKPHOS 61 06/12/2022   BILITOT <0.2 06/12/2022   Lab Results  Component Value Date   HGBA1C 5.5 06/12/2022   Lab Results  Component Value Date   INSULIN 19.2 06/12/2022   Lab Results  Component Value Date   TSH 1.910 06/12/2022   Lab Results  Component Value Date   CHOL 236 (H) 06/12/2022   HDL 67 06/12/2022   LDLCALC 148 (H) 06/12/2022   TRIG 119 06/12/2022   CHOLHDL 3.5 06/12/2022   Lab Results  Component Value Date   VD25OH 25.7 (L) 06/12/2022   Lab Results  Component Value Date   WBC 5.8  06/12/2022   HGB 12.8 06/12/2022   HCT 38.5 06/12/2022   MCV 90 06/12/2022   PLT 309 06/12/2022   No results found for: "IRON", "TIBC", "FERRITIN"  Attestation Statements:   Reviewed by clinician on day of visit: allergies, medications, problem list, medical history, surgical history, family history, social history, and previous encounter notes.  I, Malcolm Metro, am acting as Energy manager for [name], DO.  I have reviewed the above documentation for accuracy and  completeness, and I agree with the above. Glennis Brink, DO

## 2022-08-05 ENCOUNTER — Telehealth: Payer: Self-pay | Admitting: Licensed Clinical Social Worker

## 2022-08-11 ENCOUNTER — Encounter (INDEPENDENT_AMBULATORY_CARE_PROVIDER_SITE_OTHER): Payer: Self-pay | Admitting: Family Medicine

## 2022-08-11 ENCOUNTER — Ambulatory Visit (INDEPENDENT_AMBULATORY_CARE_PROVIDER_SITE_OTHER): Payer: BC Managed Care – PPO | Admitting: Family Medicine

## 2022-08-11 VITALS — BP 120/80 | HR 61 | Temp 97.8°F | Ht 62.0 in | Wt 210.0 lb

## 2022-08-11 DIAGNOSIS — E669 Obesity, unspecified: Secondary | ICD-10-CM

## 2022-08-11 DIAGNOSIS — E7849 Other hyperlipidemia: Secondary | ICD-10-CM | POA: Diagnosis not present

## 2022-08-11 DIAGNOSIS — E559 Vitamin D deficiency, unspecified: Secondary | ICD-10-CM | POA: Diagnosis not present

## 2022-08-11 DIAGNOSIS — Z6838 Body mass index (BMI) 38.0-38.9, adult: Secondary | ICD-10-CM

## 2022-08-11 DIAGNOSIS — E88819 Insulin resistance, unspecified: Secondary | ICD-10-CM

## 2022-08-11 DIAGNOSIS — Z6839 Body mass index (BMI) 39.0-39.9, adult: Secondary | ICD-10-CM

## 2022-08-13 ENCOUNTER — Encounter: Payer: Self-pay | Admitting: Licensed Clinical Social Worker

## 2022-08-13 ENCOUNTER — Ambulatory Visit: Payer: Self-pay | Admitting: Licensed Clinical Social Worker

## 2022-08-13 DIAGNOSIS — Z1379 Encounter for other screening for genetic and chromosomal anomalies: Secondary | ICD-10-CM

## 2022-08-13 NOTE — Telephone Encounter (Signed)
I contacted Ms. Hardenbrook to discuss her genetic testing results. No pathogenic variants were identified in the 77 genes analyzed. Detailed clinic note to follow.   The test report has been scanned into EPIC and is located under the Molecular Pathology section of the Results Review tab.  A portion of the result report is included below for reference.      Lacy Duverney, MS, [facility] Genetic Counselor [email or web address] Phone: [phone]

## 2022-08-13 NOTE — Progress Notes (Signed)
HPI:   Breanna Dunn was previously seen in the [place] clinic due to a family history of cancer and concerns regarding a hereditary predisposition to cancer. Please refer to our prior cancer genetics clinic note for more information regarding our discussion, assessment and recommendations, at the time. Breanna Dunn recent genetic test results were disclosed to her, as were recommendations warranted by these results. These results and recommendations are discussed in more detail below.  CANCER HISTORY:  Oncology History   No history exists.    FAMILY HISTORY:  We obtained a detailed, 4-generation family history.  Significant diagnoses are listed below: Family History  Problem Relation Age of Onset   Uterine cancer Mother        + cervical dx 59   Multiple sclerosis Mother    Colon cancer Mother        + stomach, dx 61s   Healthy Father    Depression Father    Bipolar disorder Father    Breast cancer Maternal Grandmother        dx 6s   Melanoma Maternal Grandmother    Prostate cancer Maternal Grandfather        metastatic; d. 73s   Lung cancer Paternal Grandfather    Breanna Dunn has 1 son, 6 and 1 daughter, 5. She has 2 brothers, no cancers.   Breanna Dunn mother had several cancers, uterine and cervical at age 81 (possible ovarian?) and colon/stomach cancer in her 54s. She passed at 60. Maternal grandmother had breast cancer in her 69s, melanoma on her nose, and had brothers who had cancer. Maternal grandfather had metastatic prostate cancer and passed in his 81s.   Breanna Dunn's father passed at 53. She has limited information about this side of the family. The only cancer she is aware of is her grandfather who was a heavy smoker and had lung cancer.    Breanna Dunn is unaware of previous family history of genetic testing for hereditary cancer risks. There is no reported Ashkenazi Jewish ancestry. There is no known consanguinity.      GENETIC TEST  RESULTS:  The [facility] CancerNext-Expanded+RNA Panel found no pathogenic mutations.   The CancerNext-Expanded + RNAinsight gene panel offered by [employer] and includes sequencing and rearrangement analysis for the following 77 genes: IP, ALK, APC*, ATM*, AXIN2, BAP1, BARD1, BLM, BMPR1A, BRCA1*, BRCA2*, BRIP1*, CDC73, CDH1*,CDK4, CDKN1B, CDKN2A, CHEK2*, CTNNA1, DICER1, FANCC, FH, FLCN, GALNT12, KIF1B, LZTR1, MAX, MEN1, MET, MLH1*, MSH2*, MSH3, MSH6*, MUTYH*, NBN, NF1*, NF2, NTHL1, PALB2*, PHOX2B, PMS2*, POT1, PRKAR1A, PTCH1, PTEN*, RAD51C*, RAD51D*,RB1, RECQL, RET, SDHA, SDHAF2, SDHB, SDHC, SDHD, SMAD4, SMARCA4, SMARCB1, SMARCE1, STK11, SUFU, TMEM127, TP53*,TSC1, TSC2, VHL and XRCC2 (sequencing and deletion/duplication); EGFR, EGLN1, HOXB13, KIT, MITF, PDGFRA, POLD1 and POLE (sequencing only); EPCAM and GREM1 (deletion/duplication only).   The test report has been scanned into EPIC and is located under the Molecular Pathology section of the Results Review tab.  A portion of the result report is included below for reference. Genetic testing reported out on 08/04/2022.      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  At this time, it is unknown if this variant is associated with an increased risk for cancer or if it is benign, but most uncertain variants are reclassified to benign. It should not be used to make medical management decisions. With time, we suspect the laboratory will determine the significance of this variant, if any. If the laboratory reclassifies  this variant, we will attempt to contact Breanna Dunn to discuss it further.   Even though a pathogenic variant was not identified, possible explanations for the cancer in the family may include: There may be no hereditary risk for cancer in the family. The cancers in Breanna Dunn and/or her family may be sporadic/familial or due to other genetic and environmental factors. There may be a  gene mutation in one of these genes that current testing methods cannot detect but that chance is small. There could be another gene that has not yet been discovered, or that we have not yet tested, that is responsible for the cancer diagnoses in the family.  It is also possible there is a hereditary cause for the cancer in the family that Breanna Dunn did not inherit.  Therefore, it is important to remain in touch with cancer genetics in the future so that we can continue to offer Breanna Dunn the most up to date genetic testing.   ADDITIONAL GENETIC TESTING:  We discussed with Breanna Dunn that her genetic testing was fairly extensive.  If there are additional relevant genes identified to increase cancer risk that can be analyzed in the future, we would be happy to discuss and coordinate this testing at that time.    CANCER SCREENING RECOMMENDATIONS:  Breanna Dunn test result is considered negative (normal).  This means that we have not identified a hereditary cause for her family history of cancer at this time.   An individual's cancer risk and medical management are not determined by genetic test results alone. Overall cancer risk assessment incorporates additional factors, including personal medical history, family history, and any available genetic information that may result in a personalized plan for cancer prevention and surveillance. Therefore, it is recommended she continue to follow the cancer management and screening guidelines provided by her  primary healthcare provider.  RECOMMENDATIONS FOR FAMILY MEMBERS:   Since she did not inherit a identifiable mutation in a cancer predisposition gene included on this panel, her children could not have inherited a known mutation from her in one of these genes. Individuals in this family might be at some increased risk of developing cancer, over the general population risk, due to the family history of cancer.  Individuals in the family should  notify their providers of the family history of cancer. We recommend women in this family have a yearly mammogram beginning at age 73, or 62 years younger than the earliest onset of cancer, an annual clinical breast exam, and perform monthly breast self-exams.  Family members should have colonoscopies by at age 30, or earlier, as recommended by their providers. Other members of the family may still carry a pathogenic variant in one of these genes that Breanna Dunn did not inherit. Based on the family history, we recommend her brothers/maternal relativeshave genetic counseling and testing. Breanna Dunn will let us know if we can be of any assistance in coordinating genetic counseling and/or testing for this family member.   We do not recommend familial testing for the APC variant of uncertain significance (VUS).  FOLLOW-UP:  Lastly, we discussed with Breanna Dunn that cancer genetics is a rapidly advancing field and it is possible that new genetic tests will be appropriate for her and/or her family members in the future. We encouraged her to remain in contact with cancer genetics on an annual basis so we can update her personal and family histories and let her know of advances in cancer genetics that may benefit this  family.   Our contact number was provided. Breanna Dunn questions were answered to her satisfaction, and she knows she is welcome to call us at anytime with additional questions or concerns.    Faith Rogue, MS, [facility] Genetic Counselor [email or web address] Phone: [phone]

## 2022-08-18 NOTE — Progress Notes (Signed)
Chief Complaint:   OBESITY Breanna Dunn is here to discuss her progress with her obesity treatment plan along with follow-up of her obesity related diagnoses. Breanna Dunn is on the Category 1 Plan and states she is following her eating plan approximately 25% of the time. Breanna Dunn states she is not exercising.  Today's visit was #: 4 Starting weight: 216 LBS Starting date: 06/12/2022 Today's weight: 210 LBS Today's date: 08/11/2022 Total lbs lost to date: 6 LBS Total lbs lost since last in-office visit: 4 LBS  Interim History: Patient had COVID-19 the week after Thanksgiving.  She moved into a new house.  She plans to restart meal plan.  She denies cravings for sugar.  She can walk outside.  She will make a home workout room.  She is not traveling for Christmas.  Subjective:   1. Vitamin D deficiency Patient is on prescription vitamin D 50,000 IU weekly.  06/12/2022, vitamin D level was 25.7.  2. Insulin resistance Fasting insulin was 19.2 on 06/12/2022.  She is actively working on reducing starches and sweets.  She has not yet increased exercise.  3. Other hyperlipidemia LDL 148 on 06/12/2022.  09/27/2018.  Coronary calcium score of 0.  Assessment/Plan:   1. Vitamin D deficiency Continue prescription vitamin D weekly, recheck level in February 2024.  2. Insulin resistance Options include use of metformin if needed.  3. Other hyperlipidemia Continue to work on a low saturated fat diet and increase exercise time.  4. Obesity,current BMI 38.6 1.  Resume category 1 meal plan. 2.  Home exercise plan.  Breanna Dunn is currently in the action stage of change. As such, her goal is to continue with weight loss efforts. She has agreed to the Category 1 Plan.   Exercise goals: All adults should avoid inactivity. Some physical activity is better than none, and adults who participate in any amount of physical activity gain some health benefits.  Behavioral modification strategies:  increasing lean protein intake, increasing vegetables, increasing water intake, meal planning and cooking strategies, better snacking choices, holiday eating strategies , avoiding temptations, and planning for success.  Breanna Dunn has agreed to follow-up with our clinic in 3-4 weeks. She was informed of the importance of frequent follow-up visits to maximize her success with intensive lifestyle modifications for her multiple health conditions.   Objective:   Blood pressure 120/80, pulse 61, temperature 97.8 F (36.6 C), height 5\' 2"  (1.575 m), weight 210 lb (95.3 kg), SpO2 100 %. Body mass index is 38.41 kg/m.  General: Cooperative, alert, well developed, in no acute distress. HEENT: Conjunctivae and lids unremarkable. Cardiovascular: Regular rhythm.  Lungs: Normal work of breathing. Neurologic: No focal deficits.   Lab Results  Component Value Date   CREATININE 0.92 06/12/2022   BUN 16 06/12/2022   NA 140 06/12/2022   K 4.7 06/12/2022   CL 104 06/12/2022   CO2 22 06/12/2022   Lab Results  Component Value Date   ALT 16 06/12/2022   AST 11 06/12/2022   ALKPHOS 61 06/12/2022   BILITOT <0.2 06/12/2022   Lab Results  Component Value Date   HGBA1C 5.5 06/12/2022   Lab Results  Component Value Date   INSULIN 19.2 06/12/2022   Lab Results  Component Value Date   TSH 1.910 06/12/2022   Lab Results  Component Value Date   CHOL 236 (H) 06/12/2022   HDL 67 06/12/2022   LDLCALC 148 (H) 06/12/2022   TRIG 119 06/12/2022   CHOLHDL 3.5 06/12/2022  Lab Results  Component Value Date   VD25OH 25.7 (L) 06/12/2022   Lab Results  Component Value Date   WBC 5.8 06/12/2022   HGB 12.8 06/12/2022   HCT 38.5 06/12/2022   MCV 90 06/12/2022   PLT 309 06/12/2022   No results found for: "IRON", "TIBC", "FERRITIN"  Attestation Statements:   Reviewed by clinician on day of visit: allergies, medications, problem list, medical history, surgical history, family history, social  history, and previous encounter notes.  I, Malcolm Metro, am acting as Energy manager for [name], DO.  I have reviewed the above documentation for accuracy and completeness, and I agree with the above. Glennis Brink, DO

## 2022-09-03 DIAGNOSIS — M7918 Myalgia, other site: Secondary | ICD-10-CM | POA: Diagnosis not present

## 2022-09-03 DIAGNOSIS — E538 Deficiency of other specified B group vitamins: Secondary | ICD-10-CM | POA: Diagnosis not present

## 2022-09-08 ENCOUNTER — Ambulatory Visit (INDEPENDENT_AMBULATORY_CARE_PROVIDER_SITE_OTHER): Payer: BC Managed Care – PPO | Admitting: Family Medicine

## 2022-09-11 DIAGNOSIS — N3000 Acute cystitis without hematuria: Secondary | ICD-10-CM | POA: Diagnosis not present

## 2022-09-11 DIAGNOSIS — N390 Urinary tract infection, site not specified: Secondary | ICD-10-CM | POA: Diagnosis not present

## 2022-09-11 DIAGNOSIS — Z299 Encounter for prophylactic measures, unspecified: Secondary | ICD-10-CM | POA: Diagnosis not present

## 2022-09-25 ENCOUNTER — Ambulatory Visit (INDEPENDENT_AMBULATORY_CARE_PROVIDER_SITE_OTHER): Payer: BC Managed Care – PPO | Admitting: Adult Health

## 2022-09-25 ENCOUNTER — Encounter (INDEPENDENT_AMBULATORY_CARE_PROVIDER_SITE_OTHER): Payer: Self-pay | Admitting: Adult Health

## 2022-09-25 VITALS — BP 125/89 | HR 88 | Temp 97.9°F | Ht 62.0 in | Wt 219.0 lb

## 2022-09-25 DIAGNOSIS — E559 Vitamin D deficiency, unspecified: Secondary | ICD-10-CM

## 2022-09-25 DIAGNOSIS — E669 Obesity, unspecified: Secondary | ICD-10-CM

## 2022-09-25 DIAGNOSIS — Z6841 Body Mass Index (BMI) 40.0 and over, adult: Secondary | ICD-10-CM

## 2022-09-25 DIAGNOSIS — E88819 Insulin resistance, unspecified: Secondary | ICD-10-CM

## 2022-09-25 MED ORDER — VITAMIN D (ERGOCALCIFEROL) 1.25 MG (50000 UNIT) PO CAPS: 50000.0000 [IU] | ORAL_CAPSULE | ORAL | 0 refills | Status: DC

## 2022-10-07 NOTE — Progress Notes (Unsigned)
Chief Complaint:   OBESITY Breanna Dunn is here to discuss her progress with her obesity treatment plan along with follow-up of her obesity related diagnoses. Breanna Dunn is on the Category 1 Plan and states she is following her eating plan approximately 0% of the time. Breanna Dunn states she is not exercising.   Today's visit was #: 5 Starting weight: 216 lbs Starting date: 06/12/2022 Today's weight: 219 lbs Today's date: 09/25/2022 Total lbs lost to date: 0 Total lbs lost since last in-office visit: + 9 lbs  Interim History: ***  Subjective:   1. Vitamin D deficiency On 06/12/2022, vitamin D level was 25.7.  Patient ran out of weekly ergocalciferol 2 weeks ago.  She bridged with OTC vitamin D3 1000 IU daily.  2. Insulin resistance Patient has had an intense increase in sugar cravings in the last 3 to 4 weeks.  ie,  brownie, sugary coffee drinks, doughnuts.  Assessment/Plan:   1. Vitamin D deficiency OTC vitamin D and restart prescription vitamin D.  Restart/refill- Vitamin D, Ergocalciferol, (DRISDOL) 1.25 MG (50000 UNIT) CAPS capsule; Take 1 capsule (50,000 Units total) by mouth every 7 (seven) days.  Dispense: 5 capsule; Refill: 0  2. Insulin resistance Check labs at next office visit.  Discuss metformin therapy.  3. Obesity,current BMI 40.2 Breanna Dunn is currently in the action stage of change. As such, her goal is to continue with weight loss efforts. She has agreed to the Category 1 Plan.   Exercise goals: Start using walking ***  Behavioral modification strategies: increasing lean protein intake, decreasing simple carbohydrates, meal planning and cooking strategies, keeping healthy foods in the home, better snacking choices, and planning for success.  Breanna Dunn has agreed to follow-up with our clinic in 3 weeks. She was informed of the importance of frequent follow-up visits to maximize her success with intensive lifestyle modifications for her multiple health conditions.    Objective:   Blood pressure 125/89, pulse 88, temperature 97.9 F (36.6 C), height 5\' 2"  (1.575 m), weight 219 lb (99.3 kg), SpO2 97 %. Body mass index is 40.06 kg/m.  General: Cooperative, alert, well developed, in no acute distress. HEENT: Conjunctivae and lids unremarkable. Cardiovascular: Regular rhythm.  Lungs: Normal work of breathing. Neurologic: No focal deficits.   Lab Results  Component Value Date   CREATININE 0.92 06/12/2022   BUN 16 06/12/2022   NA 140 06/12/2022   K 4.7 06/12/2022   CL 104 06/12/2022   CO2 22 06/12/2022   Lab Results  Component Value Date   ALT 16 06/12/2022   AST 11 06/12/2022   ALKPHOS 61 06/12/2022   BILITOT <0.2 06/12/2022   Lab Results  Component Value Date   HGBA1C 5.5 06/12/2022   Lab Results  Component Value Date   INSULIN 19.2 06/12/2022   Lab Results  Component Value Date   TSH 1.910 06/12/2022   Lab Results  Component Value Date   CHOL 236 (H) 06/12/2022   HDL 67 06/12/2022   LDLCALC 148 (H) 06/12/2022   TRIG 119 06/12/2022   CHOLHDL 3.5 06/12/2022   Lab Results  Component Value Date   VD25OH 25.7 (L) 06/12/2022   Lab Results  Component Value Date   WBC 5.8 06/12/2022   HGB 12.8 06/12/2022   HCT 38.5 06/12/2022   MCV 90 06/12/2022   PLT 309 06/12/2022   No results found for: "IRON", "TIBC", "FERRITIN"  Attestation Statements:   Reviewed by clinician on day of visit: allergies, medications, problem list, medical history,  surgical history, family history, social history, and previous encounter notes.  I, Davy Pique, RMA, am acting as Location manager for Mina Marble, NP.  I have reviewed the above documentation for accuracy and completeness, and I agree with the above. -  ***

## 2022-10-23 ENCOUNTER — Ambulatory Visit (INDEPENDENT_AMBULATORY_CARE_PROVIDER_SITE_OTHER): Payer: BC Managed Care – PPO | Admitting: Adult Health

## 2022-10-28 ENCOUNTER — Ambulatory Visit (INDEPENDENT_AMBULATORY_CARE_PROVIDER_SITE_OTHER): Payer: BC Managed Care – PPO | Admitting: Adult Health

## 2022-10-28 ENCOUNTER — Encounter (INDEPENDENT_AMBULATORY_CARE_PROVIDER_SITE_OTHER): Payer: Self-pay | Admitting: Adult Health

## 2022-10-28 VITALS — BP 133/87 | HR 69 | Temp 97.8°F | Ht 62.0 in | Wt 220.0 lb

## 2022-10-28 DIAGNOSIS — E669 Obesity, unspecified: Secondary | ICD-10-CM

## 2022-10-28 DIAGNOSIS — E559 Vitamin D deficiency, unspecified: Secondary | ICD-10-CM | POA: Diagnosis not present

## 2022-10-28 DIAGNOSIS — Z6841 Body Mass Index (BMI) 40.0 and over, adult: Secondary | ICD-10-CM

## 2022-10-28 DIAGNOSIS — R5383 Other fatigue: Secondary | ICD-10-CM

## 2022-10-28 DIAGNOSIS — E88819 Insulin resistance, unspecified: Secondary | ICD-10-CM | POA: Diagnosis not present

## 2022-10-28 DIAGNOSIS — F419 Anxiety disorder, unspecified: Secondary | ICD-10-CM | POA: Diagnosis not present

## 2022-10-28 MED ORDER — VITAMIN D (ERGOCALCIFEROL) 1.25 MG (50000 UNIT) PO CAPS: 50000.0000 [IU] | ORAL_CAPSULE | ORAL | 0 refills | Status: DC

## 2022-10-28 NOTE — Progress Notes (Signed)
Chief Complaint:   OBESITY Breanna Dunn is here to discuss her progress with her obesity treatment plan along with follow-up of her obesity related diagnoses. Breanna Dunn is on the Category 1 Plan and states she is following her eating plan approximately 0% of the time.  Breanna Dunn states she is not currently exercising.  Today's visit was #: 6 Starting weight: 216 lbs Starting date: 06/12/2022 Today's weight: 220 lbs Today's date: 10/28/2022 Total lbs lost to date: + 4 lbs Total lbs lost since last in-office visit: + 1 lb  Interim History:  Breanna Dunn endorses significant challenges to eat on plan. She reports 0% compliance on Cat 1 Meal Plan.  She has her children (7 yr/Connor, 5 yr/Clara) 50% time during week. Her children enjoy typical elementary school foods, which also challenge plan compliance. Per pt- her children are happy and well adjusted- both at [facility] [employer] [facility] [employer] [employer].  She reports consuming simple CHO at almost every meal, ie bagel,pasta, sub sandwiches.  She reports "constant" food noise.  Subjective:   1. Insulin resistance  Latest Reference Range & Units 06/12/22 09:30  INSULIN 2.6 - 24.9 uIU/mL 19.2   Lab Results  Component Value Date   HGBA1C 5.5 06/12/2022    She reports consuming simple CHO at almost every meal, ie bagel,pasta, sub sandwiches. She is not currently on any blood glucose lowering medications.  2. Other fatigue Ms. Curlin endorsers perpetual fatigue. She estimates 7 hrs sleep each night, with frequent early waking at 0300.  Latest Reference Range & Units 06/12/22 09:30  Vitamin D, 25-Hydroxy 30.0 - 100.0 ng/mL 25.7 (L)  Vitamin B12 232 - 1,245 pg/mL 362  (L): Data is abnormally low  3. Vitamin D deficiency  Latest Reference Range & Units 06/12/22 09:30  Vitamin D, 25-Hydroxy 30.0 - 100.0 ng/mL 25.7 (L)  (L): Data is abnormally low She is on weekly Ergocalciferol- denies N/V/Muscle Weakness.  Assessment/Plan:   1. Insulin resistance Check  Labs - Hemoglobin A1c - Insulin, random  2. Other fatigue Check Labs - Comprehensive metabolic panel - Vitamin 123456  3. Vitamin D deficiency Refill - Vitamin D, Ergocalciferol, (DRISDOL) 1.25 MG (50000 UNIT) CAPS capsule; Take 1 capsule (50,000 Units total) by mouth every 7 (seven) days.  Dispense: 5 capsule; Refill: 0 Check Labs - VITAMIN D 25 Hydroxy (Vit-D Deficiency, Fractures)  4. Obesity,current BMI 40.3  Breanna Dunn is currently in the action stage of change. As such, her goal is to get back to weightloss efforts . She has agreed to the Category 1 Plan.   1) Remove walking pad from box. 2) Increase daily protein intake.  Exercise goals: All adults should avoid inactivity. Some physical activity is better than none, and adults who participate in any amount of physical activity gain some health benefits.  Behavioral modification strategies: increasing lean protein intake, decreasing simple carbohydrates, increasing vegetables, increasing water intake, no skipping meals, meal planning and cooking strategies, keeping healthy foods in the home, ways to avoid boredom eating, ways to avoid night time snacking, better snacking choices, emotional eating strategies, planning for success, and decreasing junk food.  Breanna Dunn has agreed to follow-up with our clinic in 3 weeks. She was informed of the importance of frequent follow-up visits to maximize her success with intensive lifestyle modifications for her multiple health conditions.  Breanna Dunn was informed we would discuss her lab results at her next visit unless there is a critical issue that needs to be addressed sooner. Breanna Dunn agreed to keep her next visit at the  agreed upon time to discuss these results.  Objective:   Blood pressure 133/87, pulse 69, temperature 97.8 F (36.6 C), height '5\' 2"'$  (1.575 m), weight 220 lb (99.8 kg), SpO2 100 %. Body mass index is 40.24 kg/m.  General: Cooperative, alert, well developed, in no acute  distress. HEENT: Conjunctivae and lids unremarkable. Cardiovascular: Regular rhythm.  Lungs: Normal work of breathing. Neurologic: No focal deficits.   Lab Results  Component Value Date   CREATININE 0.92 06/12/2022   BUN 16 06/12/2022   NA 140 06/12/2022   K 4.7 06/12/2022   CL 104 06/12/2022   CO2 22 06/12/2022   Lab Results  Component Value Date   ALT 16 06/12/2022   AST 11 06/12/2022   ALKPHOS 61 06/12/2022   BILITOT <0.2 06/12/2022   Lab Results  Component Value Date   HGBA1C 5.5 06/12/2022   Lab Results  Component Value Date   INSULIN 19.2 06/12/2022   Lab Results  Component Value Date   TSH 1.910 06/12/2022   Lab Results  Component Value Date   CHOL 236 (H) 06/12/2022   HDL 67 06/12/2022   LDLCALC 148 (H) 06/12/2022   TRIG 119 06/12/2022   CHOLHDL 3.5 06/12/2022   Lab Results  Component Value Date   VD25OH 25.7 (L) 06/12/2022   Lab Results  Component Value Date   WBC 5.8 06/12/2022   HGB 12.8 06/12/2022   HCT 38.5 06/12/2022   MCV 90 06/12/2022   PLT 309 06/12/2022   No results found for: "IRON", "TIBC", "FERRITIN"  Attestation Statements:   Reviewed by clinician on day of visit: allergies, medications, problem list, medical history, surgical history, family history, social history, and previous encounter notes.  I have reviewed the above documentation for accuracy and completeness, and I agree with the above. -  Lurdes Haltiwanger d. Dory Verdun, NP-C

## 2022-10-30 LAB — COMPREHENSIVE METABOLIC PANEL
ALT: 13 IU/L (ref 0–32)
AST: 12 IU/L (ref 0–40)
Albumin/Globulin Ratio: 1.7 (ref 1.2–2.2)
Albumin: 4.4 g/dL (ref 3.9–4.9)
Alkaline Phosphatase: 60 IU/L (ref 44–121)
BUN/Creatinine Ratio: 21 (ref 9–23)
BUN: 18 mg/dL (ref 6–24)
Bilirubin Total: 0.2 mg/dL (ref 0.0–1.2)
CO2: 19 mmol/L — ABNORMAL LOW (ref 20–29)
Calcium: 9 mg/dL (ref 8.7–10.2)
Chloride: 103 mmol/L (ref 96–106)
Creatinine, Ser: 0.86 mg/dL (ref 0.57–1.00)
Globulin, Total: 2.6 g/dL (ref 1.5–4.5)
Glucose: 73 mg/dL (ref 70–99)
Potassium: 4.3 mmol/L (ref 3.5–5.2)
Sodium: 138 mmol/L (ref 134–144)
Total Protein: 7 g/dL (ref 6.0–8.5)
eGFR: 85 mL/min/{1.73_m2} (ref >59)

## 2022-10-30 LAB — VITAMIN D 25 HYDROXY (VIT D DEFICIENCY, FRACTURES): Vit D, 25-Hydroxy: 50.4 ng/mL (ref 30.0–100.0)

## 2022-10-30 LAB — HEMOGLOBIN A1C
Est. average glucose Bld gHb Est-mCnc: 117 mg/dL
Hgb A1c MFr Bld: 5.7 % — ABNORMAL HIGH (ref 4.8–5.6)

## 2022-10-30 LAB — VITAMIN B12: Vitamin B-12: 712 pg/mL (ref 232–1245)

## 2022-10-30 LAB — INSULIN, RANDOM: INSULIN: 9.9 u[IU]/mL (ref 2.6–24.9)

## 2022-11-11 DIAGNOSIS — H538 Other visual disturbances: Secondary | ICD-10-CM | POA: Diagnosis not present

## 2022-11-11 DIAGNOSIS — D8989 Other specified disorders involving the immune mechanism, not elsewhere classified: Secondary | ICD-10-CM | POA: Diagnosis not present

## 2022-11-11 DIAGNOSIS — M791 Myalgia, unspecified site: Secondary | ICD-10-CM | POA: Diagnosis not present

## 2022-11-11 DIAGNOSIS — N390 Urinary tract infection, site not specified: Secondary | ICD-10-CM | POA: Diagnosis not present

## 2022-11-11 DIAGNOSIS — R7982 Elevated C-reactive protein (CRP): Secondary | ICD-10-CM | POA: Diagnosis not present

## 2022-11-11 DIAGNOSIS — R202 Paresthesia of skin: Secondary | ICD-10-CM | POA: Diagnosis not present

## 2022-11-17 ENCOUNTER — Encounter (INDEPENDENT_AMBULATORY_CARE_PROVIDER_SITE_OTHER): Payer: Self-pay | Admitting: Adult Health

## 2022-11-17 ENCOUNTER — Ambulatory Visit (INDEPENDENT_AMBULATORY_CARE_PROVIDER_SITE_OTHER): Payer: BC Managed Care – PPO | Admitting: Adult Health

## 2022-11-17 VITALS — BP 143/92 | HR 81 | Temp 98.5°F | Ht 62.0 in | Wt 226.0 lb

## 2022-11-17 DIAGNOSIS — E559 Vitamin D deficiency, unspecified: Secondary | ICD-10-CM

## 2022-11-17 DIAGNOSIS — Z6841 Body Mass Index (BMI) 40.0 and over, adult: Secondary | ICD-10-CM

## 2022-11-17 DIAGNOSIS — E669 Obesity, unspecified: Secondary | ICD-10-CM | POA: Diagnosis not present

## 2022-11-17 DIAGNOSIS — E88819 Insulin resistance, unspecified: Secondary | ICD-10-CM

## 2022-11-17 DIAGNOSIS — R5383 Other fatigue: Secondary | ICD-10-CM | POA: Diagnosis not present

## 2022-11-17 MED ORDER — ZEPBOUND 2.5 MG/0.5ML ~~LOC~~ SOAJ: 2.5000 mg | SUBCUTANEOUS | 0 refills | Status: DC

## 2022-11-17 MED ORDER — VITAMIN D (ERGOCALCIFEROL) 1.25 MG (50000 UNIT) PO CAPS: 50000.0000 [IU] | ORAL_CAPSULE | ORAL | 0 refills | Status: DC

## 2022-11-17 NOTE — Progress Notes (Signed)
WEIGHT SUMMARY AND BIOMETRICS  Vitals Temp: 98.5 F (36.9 C) BP: (!) 143/92 Pulse Rate: 81 SpO2: 99 %   Anthropometric Measurements Height: 5\' 2"  (1.575 m) Weight: 226 lb (102.5 kg) BMI (Calculated): 41.33 Weight at Last Visit: 220lb Weight Lost Since Last Visit: 0 Weight Gained Since Last Visit: 6lb Starting Weight: 216lb Total Weight Loss (lbs): 0 lb (0 kg)   Body Composition  Body Fat %: 48.8 % Fat Mass (lbs): 110.4 lbs Muscle Mass (lbs): 110.2 lbs Total Body Water (lbs): 86.2 lbs Visceral Fat Rating : 14   Other Clinical Data Fasting: no Labs: no Today's Visit #: 7 Starting Date: 06/12/22    Chief Complaint:   OBESITY Breanna Dunn is here to discuss her progress with her obesity treatment plan. She is on the the Category 1 Plan and states she is following her eating plan approximately 25 % of the time.  She states she is exercising walking 45 minutes 3 times per week.   Interim History:  She has increased plan compliance from 0% to 25%  She continues to order food in one the nights she doesn't have her children.  She has increased water intake from 2 cups to 4 cups/daily.  Walking pad freed from the box and now using!!!  She endorses frequent polyphagia- pasta, breads  She denies family hx of MEN 2 or MTC.  She also denies personal hx of pancreatitis.  Subjective:   1. Vitamin D deficiency Discussed Labs  Latest Reference Range & Units 10/28/22 14:53  Vitamin D, 25-Hydroxy 30.0 - 100.0 ng/mL 50.4  She is on weekly Ergocalciferol- denies N/V/Muscle Weakness She continues to experience fatigue  2. Insulin resistance Discussed Labs WORSENING- A1c now prediabetic, however insulin level decreased  She endorses frequent polyphagia She denies family hx of MEN 2 or MTC.  She also denies personal hx of pancreatitis. Discussed risks/benefits of GIP/GLP-1 therapy.  Latest Reference Range & Units 10/28/22 14:53  Glucose 70 - 99 mg/dL 73   Hemoglobin A1C 4.8 - 5.6 % 5.7 (H)  Est. average glucose Bld gHb Est-mCnc mg/dL 117  INSULIN 2.6 - 24.9 uIU/mL 9.9  (H): Data is abnormally high  3. Other fatigue Discussed Labs   Latest Reference Range & Units 10/28/22 14:53  Vitamin D, 25-Hydroxy 30.0 - 100.0 ng/mL 50.4  Vitamin B12 232 - 1,245 pg/mL 712  She is on weekly Ergocalciferol and daily B12 1043mcg  She continues to endorse fatigue sx's.  Assessment/Plan:   1. Vitamin D deficiency Refill - Vitamin D, Ergocalciferol, (DRISDOL) 1.25 MG (50000 UNIT) CAPS capsule; Take 1 capsule (50,000 Units total) by mouth every 7 (seven) days.  Dispense: 5 capsule; Refill: 0  2. Insulin resistance Start Zepbound therapy  3. Other fatigue Continue Vit D and B12 supplementation. Increase regular exercise.  4. Obesity,current BMI 40.3  Start Zepbound 2.5mg  once week Disp 20ml RF 0  Breanna Dunn is currently in the action stage of change. As such, her goal is to continue with weight loss efforts. She has agreed to the Category 1 Plan.   Exercise goals: For substantial health benefits, adults should do at least 150 minutes (2 hours and 30 minutes) a week of moderate-intensity, or 75 minutes (1 hour and 15 minutes) a week of vigorous-intensity aerobic physical activity, or an equivalent combination of moderate- and vigorous-intensity aerobic activity. Aerobic activity should be performed in episodes of at least 10 minutes, and preferably, it should be spread throughout the week.  Behavioral modification strategies:  increasing lean protein intake, decreasing simple carbohydrates, increasing vegetables, increasing water intake, no skipping meals, meal planning and cooking strategies, and planning for success.  Breanna Dunn has agreed to follow-up with our clinic in 2 weeks. She was informed of the importance of frequent follow-up visits to maximize her success with intensive lifestyle modifications for her multiple health conditions.   Objective:    Blood pressure (!) 143/92, pulse 81, temperature 98.5 F (36.9 C), height 5\' 2"  (1.575 m), weight 226 lb (102.5 kg), SpO2 99 %. Body mass index is 41.34 kg/m.  General: Cooperative, alert, well developed, in no acute distress. HEENT: Conjunctivae and lids unremarkable. Cardiovascular: Regular rhythm.  Lungs: Normal work of breathing. Neurologic: No focal deficits.   Lab Results  Component Value Date   CREATININE 0.86 10/28/2022   BUN 18 10/28/2022   NA 138 10/28/2022   K 4.3 10/28/2022   CL 103 10/28/2022   CO2 19 (L) 10/28/2022   Lab Results  Component Value Date   ALT 13 10/28/2022   AST 12 10/28/2022   ALKPHOS 60 10/28/2022   BILITOT 0.2 10/28/2022   Lab Results  Component Value Date   HGBA1C 5.7 (H) 10/28/2022   HGBA1C 5.5 06/12/2022   Lab Results  Component Value Date   INSULIN 9.9 10/28/2022   INSULIN 19.2 06/12/2022   Lab Results  Component Value Date   TSH 1.910 06/12/2022   Lab Results  Component Value Date   CHOL 236 (H) 06/12/2022   HDL 67 06/12/2022   LDLCALC 148 (H) 06/12/2022   TRIG 119 06/12/2022   CHOLHDL 3.5 06/12/2022   Lab Results  Component Value Date   VD25OH 50.4 10/28/2022   VD25OH 25.7 (L) 06/12/2022   Lab Results  Component Value Date   WBC 5.8 06/12/2022   HGB 12.8 06/12/2022   HCT 38.5 06/12/2022   MCV 90 06/12/2022   PLT 309 06/12/2022   No results found for: "IRON", "TIBC", "FERRITIN"  Attestation Statements:   Reviewed by clinician on day of visit: allergies, medications, problem list, medical history, surgical history, family history, social history, and previous encounter notes.  I have reviewed the above documentation for accuracy and completeness, and I agree with the above. - Meggin Ola d. Rohn Fritsch, NP-C

## 2022-11-18 ENCOUNTER — Telehealth (INDEPENDENT_AMBULATORY_CARE_PROVIDER_SITE_OTHER): Payer: Self-pay | Admitting: Adult Health

## 2022-11-18 NOTE — Telephone Encounter (Addendum)
A PA was submitted today for Zepbound, waiting for a determination. 11/18/22   PA for Zepbound was denied on 11/20/22 with a message that states that it is not covered by your insurance. 11/24/22

## 2022-11-24 ENCOUNTER — Encounter (INDEPENDENT_AMBULATORY_CARE_PROVIDER_SITE_OTHER): Payer: Self-pay

## 2022-11-26 DIAGNOSIS — R202 Paresthesia of skin: Secondary | ICD-10-CM | POA: Diagnosis not present

## 2022-11-26 DIAGNOSIS — M791 Myalgia, unspecified site: Secondary | ICD-10-CM | POA: Diagnosis not present

## 2022-11-26 DIAGNOSIS — R7982 Elevated C-reactive protein (CRP): Secondary | ICD-10-CM | POA: Diagnosis not present

## 2022-11-26 DIAGNOSIS — H538 Other visual disturbances: Secondary | ICD-10-CM | POA: Diagnosis not present

## 2022-12-10 ENCOUNTER — Ambulatory Visit (INDEPENDENT_AMBULATORY_CARE_PROVIDER_SITE_OTHER): Payer: BC Managed Care – PPO | Admitting: Adult Health

## 2022-12-10 ENCOUNTER — Encounter (INDEPENDENT_AMBULATORY_CARE_PROVIDER_SITE_OTHER): Payer: Self-pay | Admitting: Adult Health

## 2022-12-10 VITALS — BP 121/81 | HR 92 | Temp 98.2°F | Ht 62.0 in | Wt 223.0 lb

## 2022-12-10 DIAGNOSIS — E559 Vitamin D deficiency, unspecified: Secondary | ICD-10-CM | POA: Diagnosis not present

## 2022-12-10 DIAGNOSIS — F419 Anxiety disorder, unspecified: Secondary | ICD-10-CM

## 2022-12-10 DIAGNOSIS — F32A Depression, unspecified: Secondary | ICD-10-CM | POA: Diagnosis not present

## 2022-12-10 DIAGNOSIS — E669 Obesity, unspecified: Secondary | ICD-10-CM | POA: Diagnosis not present

## 2022-12-10 DIAGNOSIS — Z6841 Body Mass Index (BMI) 40.0 and over, adult: Secondary | ICD-10-CM

## 2022-12-10 MED ORDER — VITAMIN D (ERGOCALCIFEROL) 1.25 MG (50000 UNIT) PO CAPS
50000.0000 [IU] | ORAL_CAPSULE | ORAL | 0 refills | Status: DC
Start: 2022-12-10 — End: 2023-02-09

## 2022-12-10 NOTE — Progress Notes (Signed)
WEIGHT SUMMARY AND BIOMETRICS  Vitals Temp: 98.2 F (36.8 C) BP: 121/81 Pulse Rate: 92 SpO2: 98 %   Anthropometric Measurements Height: 5\' 2"  (1.575 m) Weight: 223 lb (101.2 kg) BMI (Calculated): 40.78 Weight at Last Visit: 226lb Weight Lost Since Last Visit: 3lb Weight Gained Since Last Visit: 0 Starting Weight: 216lb Total Weight Loss (lbs): 0 lb (0 kg)   Body Composition  Body Fat %: 47.3 % Fat Mass (lbs): 105.8 lbs Muscle Mass (lbs): 111.8 lbs Total Body Water (lbs): 82.6 lbs Visceral Fat Rating : 13   Other Clinical Data Fasting: No Labs: No Today's Visit #: 8 Starting Date: 06/01/22    Chief Complaint:   OBESITY Breanna Dunn is here to discuss her progress with her obesity treatment plan. She is on the the Category 1 Plan and states she is following her eating plan approximately 20 % of the time.  She states she is exercising walking ? minutes 5 times per week.   Interim History:  She and her ex-husband and their 2 children went to [facility] for [employer]. She walked >20,000 steps a day while in [place]. Upon her return home, she has continued keeping up her steps via "Walking Pad" and achieving 10K steps/day- [employer]!  She is so please to have lost 3 lbs since last OV.  She will travel to [place] in early May 2024- work trip.  Her insurance will not cover Zepbound therapy.  Will keep Metformin therapy as an option in future if polyphagia becomes an issue.  Subjective:   1. Vitamin D deficiency  Latest Reference Range & Units 06/12/22 09:30 10/28/22 14:53  Vitamin D, 25-Hydroxy 30.0 - 100.0 ng/mL 25.7 (L) 50.4  (L): Data is abnormally low Weekly Ergocalciferol therapy has improved Vit D level and energy levels.  2. Anxiety and depression She reports anxiety levels are well controlled with daily low dose Fluoxetine 10mg .  Assessment/Plan:   1. Vitamin D deficiency Refill  Vitamin D, Ergocalciferol, (DRISDOL) 1.25 MG  (50000 UNIT) CAPS capsule Take 1 capsule (50,000 Units total) by mouth every 7 (seven) days. Dispense: 5 capsule, Refills: 0 ordered   2. Anxiety and depression Continue daily SSRI therapy  3. Obesity,current BMI 40.78  Breanna Dunn is currently in the action stage of change. As such, her goal is to continue with weight loss efforts. She has agreed to the Category 1 Plan.   Exercise goals: All adults should avoid inactivity. Some physical activity is better than none, and adults who participate in any amount of physical activity gain some health benefits. For additional and more extensive health benefits, adults should increase their aerobic physical activity to 300 minutes (5 hours) a week of moderate-intensity, or 150 minutes a week of vigorous-intensity aerobic physical activity, or an equivalent combination of moderate- and vigorous-intensity activity. Additional health benefits are gained by engaging in physical activity beyond this amount.  Adults should also include muscle-strengthening activities that involve all major muscle groups on 2 or more days a week.  Behavioral modification strategies: increasing lean protein intake, decreasing simple carbohydrates, increasing vegetables, increasing water intake, meal planning and cooking strategies, and planning for success.  Breanna Dunn has agreed to follow-up with our clinic in 4 weeks. She was informed of the importance of frequent follow-up visits to maximize her success with intensive lifestyle modifications for her multiple health conditions.   Objective:   Blood pressure 121/81, pulse 92, temperature 98.2 F (36.8 C), height 5\' 2"  (1.575 m), weight  223 lb (101.2 kg), SpO2 98 %. Body mass index is 40.79 kg/m.  General: Cooperative, alert, well developed, in no acute distress. HEENT: Conjunctivae and lids unremarkable. Cardiovascular: Regular rhythm.  Lungs: Normal work of breathing. Neurologic: No focal deficits.   Lab Results  Component  Value Date   CREATININE 0.86 10/28/2022   BUN 18 10/28/2022   NA 138 10/28/2022   K 4.3 10/28/2022   CL 103 10/28/2022   CO2 19 (L) 10/28/2022   Lab Results  Component Value Date   ALT 13 10/28/2022   AST 12 10/28/2022   ALKPHOS 60 10/28/2022   BILITOT 0.2 10/28/2022   Lab Results  Component Value Date   HGBA1C 5.7 (H) 10/28/2022   HGBA1C 5.5 06/12/2022   Lab Results  Component Value Date   INSULIN 9.9 10/28/2022   INSULIN 19.2 06/12/2022   Lab Results  Component Value Date   TSH 1.910 06/12/2022   Lab Results  Component Value Date   CHOL 236 (H) 06/12/2022   HDL 67 06/12/2022   LDLCALC 148 (H) 06/12/2022   TRIG 119 06/12/2022   CHOLHDL 3.5 06/12/2022   Lab Results  Component Value Date   VD25OH 50.4 10/28/2022   VD25OH 25.7 (L) 06/12/2022   Lab Results  Component Value Date   WBC 5.8 06/12/2022   HGB 12.8 06/12/2022   HCT 38.5 06/12/2022   MCV 90 06/12/2022   PLT 309 06/12/2022   No results found for: "IRON", "TIBC", "FERRITIN"  Attestation Statements:   Reviewed by clinician on day of visit: allergies, medications, problem list, medical history, surgical history, family history, social history, and previous encounter notes.  I have reviewed the above documentation for accuracy and completeness, and I agree with the above. -  Adela Esteban d. Ozil Stettler, NP-C

## 2023-01-12 ENCOUNTER — Ambulatory Visit (INDEPENDENT_AMBULATORY_CARE_PROVIDER_SITE_OTHER): Payer: BC Managed Care – PPO | Admitting: Adult Health

## 2023-01-28 DIAGNOSIS — N3000 Acute cystitis without hematuria: Secondary | ICD-10-CM | POA: Diagnosis not present

## 2023-02-09 ENCOUNTER — Encounter (INDEPENDENT_AMBULATORY_CARE_PROVIDER_SITE_OTHER): Payer: Self-pay | Admitting: Adult Health

## 2023-02-09 ENCOUNTER — Ambulatory Visit (INDEPENDENT_AMBULATORY_CARE_PROVIDER_SITE_OTHER): Payer: BC Managed Care – PPO | Admitting: Adult Health

## 2023-02-09 VITALS — BP 128/83 | HR 86 | Temp 97.9°F | Ht 62.0 in | Wt 233.0 lb

## 2023-02-09 DIAGNOSIS — Z6841 Body Mass Index (BMI) 40.0 and over, adult: Secondary | ICD-10-CM

## 2023-02-09 DIAGNOSIS — E88819 Insulin resistance, unspecified: Secondary | ICD-10-CM

## 2023-02-09 DIAGNOSIS — E669 Obesity, unspecified: Secondary | ICD-10-CM | POA: Diagnosis not present

## 2023-02-09 DIAGNOSIS — E559 Vitamin D deficiency, unspecified: Secondary | ICD-10-CM | POA: Diagnosis not present

## 2023-02-09 MED ORDER — METFORMIN HCL 500 MG PO TABS: ORAL_TABLET | ORAL | 0 refills | Status: DC

## 2023-02-09 MED ORDER — VITAMIN D (ERGOCALCIFEROL) 1.25 MG (50000 UNIT) PO CAPS
50000.0000 [IU] | ORAL_CAPSULE | ORAL | 0 refills | Status: DC
Start: 2023-02-09 — End: 2023-03-17

## 2023-02-09 NOTE — Progress Notes (Signed)
WEIGHT SUMMARY AND BIOMETRICS  Vitals Temp: 97.9 F (36.6 C) BP: 128/83 Pulse Rate: 86 SpO2: 96 %   Anthropometric Measurements Height: 5\' 2"  (1.575 m) Weight: 233 lb (105.7 kg) BMI (Calculated): 42.61 Weight at Last Visit: 223 lb Weight Lost Since Last Visit: 0 Weight Gained Since Last Visit: 11 lb Starting Weight: 216 lb Total Weight Loss (lbs): 0 lb (0 kg) Peak Weight: 233 lb   Body Composition  Body Fat %: 47.2 % Fat Mass (lbs): 110.2 lbs Muscle Mass (lbs): 117 lbs Total Body Water (lbs): 83.2 lbs Visceral Fat Rating : 14   Other Clinical Data Fasting: no Labs: no Today's Visit #: 9 Starting Date: 06/12/22    Chief Complaint:   OBESITYthe Category 1 Plan Breanna Dunn is here to discuss her progress with her obesity treatment plan. She is on the the Category 1 Plan and states she is following her eating plan approximately 10 % of the time. She states she is not currently exercising.   Interim History:  She recently traveled to [place] for 7 days. She will travel to [place] this weekend for another 7 day trip. These are works trips,no opportunity to exercise and food options are limited.  Her insurance will not cover Zepbound, she is agreeable to start Metformin therapy.  Subjective:   1. Insulin resistance  Latest Reference Range & Units 06/12/22 09:30 10/28/22 14:53  INSULIN 2.6 - 24.9 uIU/mL 19.2 9.9    Latest Reference Range & Units 10/28/22 14:53  eGFR >59 mL/min/1.73 85   Her insurance will not cover Zepbound, she is agreeable to start Metformin therapy. Discussed to take with meal, not on empty stomach  2. Vitamin D deficiency  Latest Reference Range & Units 06/12/22 09:30 10/28/22 14:53  Vitamin D, 25-Hydroxy 30.0 - 100.0 ng/mL 25.7 (L) 50.4  (L): Data is abnormally low  She is on weekly Ergocalciferol-denies N/V/Muscle Weakness  Assessment/Plan:   1. Insulin resistance Start  metFORMIN (GLUCOPHAGE) 500 MG tablet 1/2 tab daily  with meal for one week then increase to 1 full tab daily- hold a this dose Dispense: 30 tablet, Refills: 0 ordered    2. Vitamin D deficiency Refill - Vitamin D, Ergocalciferol, (DRISDOL) 1.25 MG (50000 UNIT) CAPS capsule; Take 1 capsule (50,000 Units total) by mouth every 7 (seven) days.  Dispense: 5 capsule; Refill: 0  3. Obesity,current BMI 42.7  Breanna Dunn is currently in the action stage of change. As such, her goal is to get back to weightloss efforts . She has agreed to the Category 1 Plan.   Exercise goals: All adults should avoid inactivity. Some physical activity is better than none, and adults who participate in any amount of physical activity gain some health benefits.  Behavioral modification strategies: increasing lean protein intake, decreasing simple carbohydrates, increasing vegetables, increasing water intake, no skipping meals, meal planning and cooking strategies, and planning for success.  Breanna Dunn has agreed to follow-up with our clinic in 4 weeks. She was informed of the importance of frequent follow-up visits to maximize her success with intensive lifestyle modifications for her multiple health conditions.   Check CMP at next OV, re: started Metformin therapy  Objective:   Blood pressure 128/83, pulse 86, temperature 97.9 F (36.6 C), height 5\' 2"  (1.575 m), weight 233 lb (105.7 kg), SpO2 96 %. Body mass index is 42.62 kg/m.  General: Cooperative, alert, well developed, in no acute distress. HEENT: Conjunctivae and lids unremarkable. Cardiovascular: Regular rhythm.  Lungs: Normal work of  breathing. Neurologic: No focal deficits.   Lab Results  Component Value Date   CREATININE 0.86 10/28/2022   BUN 18 10/28/2022   NA 138 10/28/2022   K 4.3 10/28/2022   CL 103 10/28/2022   CO2 19 (L) 10/28/2022   Lab Results  Component Value Date   ALT 13 10/28/2022   AST 12 10/28/2022   ALKPHOS 60 10/28/2022   BILITOT 0.2 10/28/2022   Lab Results  Component Value  Date   HGBA1C 5.7 (H) 10/28/2022   HGBA1C 5.5 06/12/2022   Lab Results  Component Value Date   INSULIN 9.9 10/28/2022   INSULIN 19.2 06/12/2022   Lab Results  Component Value Date   TSH 1.910 06/12/2022   Lab Results  Component Value Date   CHOL 236 (H) 06/12/2022   HDL 67 06/12/2022   LDLCALC 148 (H) 06/12/2022   TRIG 119 06/12/2022   CHOLHDL 3.5 06/12/2022   Lab Results  Component Value Date   VD25OH 50.4 10/28/2022   VD25OH 25.7 (L) 06/12/2022   Lab Results  Component Value Date   WBC 5.8 06/12/2022   HGB 12.8 06/12/2022   HCT 38.5 06/12/2022   MCV 90 06/12/2022   PLT 309 06/12/2022   No results found for: "IRON", "TIBC", "FERRITIN"  Attestation Statements:   Reviewed by clinician on day of visit: allergies, medications, problem list, medical history, surgical history, family history, social history, and previous encounter notes.  I have reviewed the above documentation for accuracy and completeness, and I agree with the above. -  Breanna Dunn d. Maylynn Orzechowski, NP-C

## 2023-03-10 DIAGNOSIS — H18453 Nodular corneal degeneration, bilateral: Secondary | ICD-10-CM | POA: Diagnosis not present

## 2023-03-17 ENCOUNTER — Encounter (INDEPENDENT_AMBULATORY_CARE_PROVIDER_SITE_OTHER): Payer: Self-pay | Admitting: Adult Health

## 2023-03-17 ENCOUNTER — Ambulatory Visit (INDEPENDENT_AMBULATORY_CARE_PROVIDER_SITE_OTHER): Payer: BC Managed Care – PPO | Admitting: Adult Health

## 2023-03-17 VITALS — BP 123/83 | HR 88 | Temp 98.2°F | Ht 62.0 in | Wt 234.0 lb

## 2023-03-17 DIAGNOSIS — E559 Vitamin D deficiency, unspecified: Secondary | ICD-10-CM

## 2023-03-17 DIAGNOSIS — Z6841 Body Mass Index (BMI) 40.0 and over, adult: Secondary | ICD-10-CM

## 2023-03-17 DIAGNOSIS — E669 Obesity, unspecified: Secondary | ICD-10-CM | POA: Diagnosis not present

## 2023-03-17 DIAGNOSIS — F419 Anxiety disorder, unspecified: Secondary | ICD-10-CM

## 2023-03-17 DIAGNOSIS — E88819 Insulin resistance, unspecified: Secondary | ICD-10-CM | POA: Diagnosis not present

## 2023-03-17 MED ORDER — METFORMIN HCL 1000 MG PO TABS: ORAL_TABLET | ORAL | 0 refills | Status: DC

## 2023-03-17 MED ORDER — VITAMIN D (ERGOCALCIFEROL) 1.25 MG (50000 UNIT) PO CAPS
50000.0000 [IU] | ORAL_CAPSULE | ORAL | 0 refills | Status: DC
Start: 2023-03-17 — End: 2023-04-20

## 2023-03-17 NOTE — Progress Notes (Signed)
WEIGHT SUMMARY AND BIOMETRICS  Vitals Temp: 98.2 F (36.8 C) BP: 123/83 Pulse Rate: 88 SpO2: 98 %   Anthropometric Measurements Height: 5\' 2"  (1.575 m) Weight: 234 lb (106.1 kg) BMI (Calculated): 42.79 Weight Gained Since Last Visit: 1 Starting Weight: 216 lb Peak Weight: 233 lb   Body Composition  Body Fat %: 48.7 % Fat Mass (lbs): 114 lbs Muscle Mass (lbs): 114 lbs Total Body Water (lbs): 85.2 lbs Visceral Fat Rating : 15   Other Clinical Data Fasting: no Labs: no Today's Visit #: 10 Starting Date: 06/12/22    Chief Complaint:   OBESITY Breanna Dunn is here to discuss her progress with her obesity treatment plan. She is on the the Category 1 Plan and states she is following her eating plan approximately 20 % of the time. She states she is not currently exercising.   Interim History:  Breanna Dunn has finished her international trips, home for the foreseeable. She waited to start Metformin until she returned home. She started on Metformin 500mg  1/2 tab every day, then increased to full tab 7 days ago. She endorses decreased polyphagia and denies GI upset.  Subjective:   1. Vitamin D deficiency  Latest Reference Range & Units 10/28/22 14:53  Vitamin D, 25-Hydroxy 30.0 - 100.0 ng/mL 50.4   She is currently on weekly Ergocalciferol- denies N/V/Muscle Weakness  2. Insulin resistance  Latest Reference Range & Units 06/12/22 09:30 10/28/22 14:53  INSULIN 2.6 - 24.9 uIU/mL 19.2 9.9   She started on Metformin 500mg  1/2 tab every day, then increased to full tab 7 days ago. She endorses decreased polyphagia and denies GI upset.  Assessment/Plan:   1. Vitamin D deficiency Refill - Vitamin D, Ergocalciferol, (DRISDOL) 1.25 MG (50000 UNIT) CAPS capsule; Take 1 capsule (50,000 Units total) by mouth every 7 (seven) days.  Dispense: 5 capsule; Refill: 0  2. Insulin resistance Refill and increase metFORMIN (GLUCOPHAGE) 1000 MG tablet 1 tab daily with lunch  Dispense: 60 tablet, Refills: 0 ordered   4. Obesity, current BMI 42.8  Breanna Dunn is not currently in the action stage of change. As such, her goal is to get back to weightloss efforts . She has agreed to the Category 1 Plan.   Exercise goals: All adults should avoid inactivity. Some physical activity is better than none, and adults who participate in any amount of physical activity gain some health benefits. Adults should also include muscle-strengthening activities that involve all major muscle groups on 2 or more days a week.  Behavioral modification strategies: increasing lean protein intake, decreasing simple carbohydrates, increasing vegetables, increasing water intake, decreasing eating out, no skipping meals, meal planning and cooking strategies, better snacking choices, and planning for success.  Breanna Dunn has agreed to follow-up with our clinic in 3 weeks. She was informed of the importance of frequent follow-up visits to maximize her success with intensive lifestyle modifications for her multiple health conditions.   Objective:   Blood pressure 123/83, pulse 88, temperature 98.2 F (36.8 C), height 5\' 2"  (1.575 m), weight 234 lb (106.1 kg), SpO2 98%. Body mass index is 42.8 kg/m.  General: Cooperative, alert, well developed, in no acute distress. HEENT: Conjunctivae and lids unremarkable. Cardiovascular: Regular rhythm.  Lungs: Normal work of breathing. Neurologic: No focal deficits.   Lab Results  Component Value Date   CREATININE 0.86 10/28/2022   BUN 18 10/28/2022   NA 138 10/28/2022   K 4.3 10/28/2022   CL 103 10/28/2022   CO2 19 (  L) 10/28/2022   Lab Results  Component Value Date   ALT 13 10/28/2022   AST 12 10/28/2022   ALKPHOS 60 10/28/2022   BILITOT 0.2 10/28/2022   Lab Results  Component Value Date   HGBA1C 5.7 (H) 10/28/2022   HGBA1C 5.5 06/12/2022   Lab Results  Component Value Date   INSULIN 9.9 10/28/2022   INSULIN 19.2 06/12/2022   Lab Results   Component Value Date   TSH 1.910 06/12/2022   Lab Results  Component Value Date   CHOL 236 (H) 06/12/2022   HDL 67 06/12/2022   LDLCALC 148 (H) 06/12/2022   TRIG 119 06/12/2022   CHOLHDL 3.5 06/12/2022   Lab Results  Component Value Date   VD25OH 50.4 10/28/2022   VD25OH 25.7 (L) 06/12/2022   Lab Results  Component Value Date   WBC 5.8 06/12/2022   HGB 12.8 06/12/2022   HCT 38.5 06/12/2022   MCV 90 06/12/2022   PLT 309 06/12/2022   No results found for: "IRON", "TIBC", "FERRITIN"  Attestation Statements:   Reviewed by clinician on day of visit: allergies, medications, problem list, medical history, surgical history, family history, social history, and previous encounter notes.  I have reviewed the above documentation for accuracy and completeness, and I agree with the above. -  Breanna Dunn d. Thorvald Orsino, NP-C

## 2023-04-08 DIAGNOSIS — N390 Urinary tract infection, site not specified: Secondary | ICD-10-CM | POA: Diagnosis not present

## 2023-04-20 ENCOUNTER — Ambulatory Visit (INDEPENDENT_AMBULATORY_CARE_PROVIDER_SITE_OTHER): Payer: BC Managed Care – PPO | Admitting: Adult Health

## 2023-04-20 ENCOUNTER — Encounter (INDEPENDENT_AMBULATORY_CARE_PROVIDER_SITE_OTHER): Payer: Self-pay | Admitting: Adult Health

## 2023-04-20 VITALS — BP 120/82 | HR 91 | Temp 98.5°F | Ht 62.0 in | Wt 236.0 lb

## 2023-04-20 DIAGNOSIS — Z6839 Body mass index (BMI) 39.0-39.9, adult: Secondary | ICD-10-CM

## 2023-04-20 DIAGNOSIS — E88819 Insulin resistance, unspecified: Secondary | ICD-10-CM

## 2023-04-20 DIAGNOSIS — E669 Obesity, unspecified: Secondary | ICD-10-CM | POA: Diagnosis not present

## 2023-04-20 DIAGNOSIS — Z6841 Body Mass Index (BMI) 40.0 and over, adult: Secondary | ICD-10-CM | POA: Diagnosis not present

## 2023-04-20 DIAGNOSIS — E559 Vitamin D deficiency, unspecified: Secondary | ICD-10-CM | POA: Diagnosis not present

## 2023-04-20 MED ORDER — VITAMIN D (ERGOCALCIFEROL) 1.25 MG (50000 UNIT) PO CAPS
50000.0000 [IU] | ORAL_CAPSULE | ORAL | 0 refills | Status: DC
Start: 2023-04-20 — End: 2023-07-20

## 2023-04-20 MED ORDER — METFORMIN HCL 1000 MG PO TABS: ORAL_TABLET | ORAL | 0 refills | Status: DC

## 2023-04-20 NOTE — Progress Notes (Signed)
WEIGHT SUMMARY AND BIOMETRICS  Vitals Temp: 98.5 F (36.9 C) BP: 120/82 Pulse Rate: 91 SpO2: 100 %   Anthropometric Measurements Height: 5\' 2"  (1.575 m) Weight: 236 lb (107 kg) BMI (Calculated): 43.15 Weight at Last Visit: 234lb Weight Lost Since Last Visit: 0 Weight Gained Since Last Visit: 2lb Starting Weight: 216lb Total Weight Loss (lbs): 0 lb (0 kg) Peak Weight: 233lb   Body Composition  Body Fat %: 49.2 % Fat Mass (lbs): 116.4 lbs Muscle Mass (lbs): 114.2 lbs Total Body Water (lbs): 85.2 lbs Visceral Fat Rating : 15   Other Clinical Data Fasting: no Labs: no Today's Visit #: 11 Starting Date: 06/12/22    Chief Complaint:   OBESITY Breanna Dunn is here to discuss her progress with her obesity treatment plan. She is on the the Category 1 Plan and states she is following her eating plan approximately 20 % of the time. She states she is exercising NEAT activities.   Interim History:  03/17/2023- Metformin increased from 500mg  to 1000mg  Since then she endorses regular menstrual cycles and decreased facial hair. She also endorses increased energy and improved quality and length of sleep. She believes she had/has PCOS- will discuss at regular f/u with GYN  Upcoming work travel: [place]  Subjective:   1. Vitamin D deficiency  Latest Reference Range & Units 06/12/22 09:30 10/28/22 14:53  Vitamin D, 25-Hydroxy 30.0 - 100.0 ng/mL 25.7 (L) 50.4  (L): Data is abnormally low  She is on Ergocalciferol- denies N/N/Muscle Weakness  2. Insulin resistance  Latest Reference Range & Units 06/12/22 09:30 10/28/22 14:53  INSULIN 2.6 - 24.9 uIU/mL 19.2 9.9    Latest Reference Range & Units 10/28/22 14:53  Glucose 70 - 99 mg/dL 73  Hemoglobin N8G 4.8 - 5.6 % 5.7 (H)  Est. average glucose Bld gHb Est-mCnc mg/dL 956  INSULIN 2.6 - 21.3 uIU/mL 9.9  (H): Data is abnormally high  03/17/2023- Metformin increased from 500mg  to 1000mg  She denies GI  upset with increase Since then she endorses regular menstrual cycles and decreased facial hair. She also endorses increased energy and improved quality and length of sleep. She believes she had/has PCOS- will discuss at regular f/u with GYN  She is on daily Beyaz 3/0.02/0.451- she reports 100% compliance She is sexually active Counseled on increased fertility with Metformin use  Assessment/Plan:   1. Vitamin D deficiency Refill - Vitamin D, Ergocalciferol, (DRISDOL) 1.25 MG (50000 UNIT) CAPS capsule; Take 1 capsule (50,000 Units total) by mouth every 7 (seven) days.  Dispense: 5 capsule; Refill: 0  2. Insulin resistance Refill metFORMIN (GLUCOPHAGE) 1000 MG tablet 1 tab daily with lunch Dispense: 90 tablet, Refills: 0 ordered  CHECK LABS AT NEXT OV  3. Obesity,current BMI 43.15  [name] is currently in the action stage of change. As such, her goal is to continue with weight loss efforts. She has agreed to the Category 1 Plan.   Exercise goals: All adults should avoid inactivity. Some physical activity is better than none, and adults who participate in any amount of physical activity gain some health benefits. Adults should also include muscle-strengthening activities that involve all major muscle groups on 2 or more days a week.  Behavioral modification strategies: increasing lean protein intake, decreasing simple carbohydrates, increasing vegetables, increasing water intake, decreasing eating out, no skipping meals, meal planning and cooking strategies, and planning for success.  Kayline has agreed to follow-up with our clinic in 4 weeks. She was informed  of the importance of frequent follow-up visits to maximize her success with intensive lifestyle modifications for her multiple health conditions.   Check Fasting Labs at next OV  Objective:   Blood pressure 120/82, pulse 91, temperature 98.5 F (36.9 C), height 5\' 2"  (1.575 m), weight 236 lb (107 kg), SpO2 100%. Body mass  index is 43.16 kg/m.  General: Cooperative, alert, well developed, in no acute distress. HEENT: Conjunctivae and lids unremarkable. Cardiovascular: Regular rhythm.  Lungs: Normal work of breathing. Neurologic: No focal deficits.   Lab Results  Component Value Date   CREATININE 0.86 10/28/2022   BUN 18 10/28/2022   NA 138 10/28/2022   K 4.3 10/28/2022   CL 103 10/28/2022   CO2 19 (L) 10/28/2022   Lab Results  Component Value Date   ALT 13 10/28/2022   AST 12 10/28/2022   ALKPHOS 60 10/28/2022   BILITOT 0.2 10/28/2022   Lab Results  Component Value Date   HGBA1C 5.7 (H) 10/28/2022   HGBA1C 5.5 06/12/2022   Lab Results  Component Value Date   INSULIN 9.9 10/28/2022   INSULIN 19.2 06/12/2022   Lab Results  Component Value Date   TSH 1.910 06/12/2022   Lab Results  Component Value Date   CHOL 236 (H) 06/12/2022   HDL 67 06/12/2022   LDLCALC 148 (H) 06/12/2022   TRIG 119 06/12/2022   CHOLHDL 3.5 06/12/2022   Lab Results  Component Value Date   VD25OH 50.4 10/28/2022   VD25OH 25.7 (L) 06/12/2022   Lab Results  Component Value Date   WBC 5.8 06/12/2022   HGB 12.8 06/12/2022   HCT 38.5 06/12/2022   MCV 90 06/12/2022   PLT 309 06/12/2022   No results found for: "IRON", "TIBC", "FERRITIN"  Attestation Statements:   Reviewed by clinician on day of visit: allergies, medications, problem list, medical history, surgical history, family history, social history, and previous encounter notes.  I have reviewed the above documentation for accuracy and completeness, and I agree with the above. -  Braden Deloach d. Sibbie Flammia, NP-C

## 2023-06-01 ENCOUNTER — Ambulatory Visit (INDEPENDENT_AMBULATORY_CARE_PROVIDER_SITE_OTHER): Payer: BC Managed Care – PPO | Admitting: Adult Health

## 2023-06-02 DIAGNOSIS — Z118 Encounter for screening for other infectious and parasitic diseases: Secondary | ICD-10-CM | POA: Diagnosis not present

## 2023-06-02 DIAGNOSIS — Z124 Encounter for screening for malignant neoplasm of cervix: Secondary | ICD-10-CM | POA: Diagnosis not present

## 2023-06-02 DIAGNOSIS — Z113 Encounter for screening for infections with a predominantly sexual mode of transmission: Secondary | ICD-10-CM | POA: Diagnosis not present

## 2023-06-02 DIAGNOSIS — Z1159 Encounter for screening for other viral diseases: Secondary | ICD-10-CM | POA: Diagnosis not present

## 2023-06-02 DIAGNOSIS — Z01419 Encounter for gynecological examination (general) (routine) without abnormal findings: Secondary | ICD-10-CM | POA: Diagnosis not present

## 2023-06-02 DIAGNOSIS — Z114 Encounter for screening for human immunodeficiency virus [HIV]: Secondary | ICD-10-CM | POA: Diagnosis not present

## 2023-06-02 DIAGNOSIS — Z1231 Encounter for screening mammogram for malignant neoplasm of breast: Secondary | ICD-10-CM | POA: Diagnosis not present

## 2023-06-02 DIAGNOSIS — Z1331 Encounter for screening for depression: Secondary | ICD-10-CM | POA: Diagnosis not present

## 2023-06-15 ENCOUNTER — Ambulatory Visit (INDEPENDENT_AMBULATORY_CARE_PROVIDER_SITE_OTHER): Payer: BC Managed Care – PPO | Admitting: Adult Health

## 2023-06-22 ENCOUNTER — Ambulatory Visit (INDEPENDENT_AMBULATORY_CARE_PROVIDER_SITE_OTHER): Payer: BC Managed Care – PPO | Admitting: Adult Health

## 2023-06-29 ENCOUNTER — Ambulatory Visit (INDEPENDENT_AMBULATORY_CARE_PROVIDER_SITE_OTHER): Payer: BC Managed Care – PPO | Admitting: Adult Health

## 2023-07-20 ENCOUNTER — Encounter (INDEPENDENT_AMBULATORY_CARE_PROVIDER_SITE_OTHER): Payer: Self-pay | Admitting: Adult Health

## 2023-07-20 ENCOUNTER — Ambulatory Visit (INDEPENDENT_AMBULATORY_CARE_PROVIDER_SITE_OTHER): Payer: BC Managed Care – PPO | Admitting: Adult Health

## 2023-07-20 VITALS — BP 138/91 | HR 82 | Temp 98.3°F | Ht 62.0 in | Wt 237.0 lb

## 2023-07-20 DIAGNOSIS — Z6841 Body Mass Index (BMI) 40.0 and over, adult: Secondary | ICD-10-CM | POA: Diagnosis not present

## 2023-07-20 DIAGNOSIS — E559 Vitamin D deficiency, unspecified: Secondary | ICD-10-CM | POA: Diagnosis not present

## 2023-07-20 DIAGNOSIS — E66812 Obesity, class 2: Secondary | ICD-10-CM

## 2023-07-20 DIAGNOSIS — E88819 Insulin resistance, unspecified: Secondary | ICD-10-CM

## 2023-07-20 DIAGNOSIS — Z0000 Encounter for general adult medical examination without abnormal findings: Secondary | ICD-10-CM | POA: Diagnosis not present

## 2023-07-20 DIAGNOSIS — E669 Obesity, unspecified: Secondary | ICD-10-CM | POA: Diagnosis not present

## 2023-07-20 MED ORDER — VITAMIN D (ERGOCALCIFEROL) 1.25 MG (50000 UNIT) PO CAPS: 50000.0000 [IU] | ORAL_CAPSULE | ORAL | 0 refills | Status: DC

## 2023-07-20 MED ORDER — METFORMIN HCL 1000 MG PO TABS: ORAL_TABLET | ORAL | 0 refills | Status: DC

## 2023-07-20 NOTE — Progress Notes (Signed)
WEIGHT SUMMARY AND BIOMETRICS  Vitals Temp: 98.3 F (36.8 C) BP: (!) 138/91 Pulse Rate: 82 SpO2: 96 %   Anthropometric Measurements Height: 5\' 2"  (1.575 m) Weight: 237 lb (107.5 kg) BMI (Calculated): 43.34 Weight at Last Visit: 236lb Weight Lost Since Last Visit: 0 Weight Gained Since Last Visit: 1lb Starting Weight: 216lb Total Weight Loss (lbs): 0 lb (0 kg)   Body Composition  Body Fat %: 48.8 % Fat Mass (lbs): 115.8 lbs Muscle Mass (lbs): 115.4 lbs Total Body Water (lbs): 84.2 lbs Visceral Fat Rating : 15   Other Clinical Data Fasting: yes Labs: yes Today's Visit #: 12 Starting Date: 06/12/22    Chief Complaint:   OBESITY Colina is here to discuss her progress with her obesity treatment plan. She is on the the Category 1 Plan and states she is following her eating plan approximately 0 % of the time.  She states she is not currently exercising.   Interim History:  Ms. Fincke returned late last night from whirlwind travel. She has been to several [place] states and [place].  She is thrilled to have essentially maintained her weight with the frequent travel.  She will celebrate her 8 year son's birthday tonight!  She prefers not to take Metformin 1000mg  daily when she travels, have been off > 4 weeks   Subjective:   1. Healthcare maintenance [facility]/Dr. Marga Hoots provided her primary care.  2. Insulin resistance Due to travel, she has been off daily Metformin 1000mg  > 4 weeks  Latest Reference Range & Units 06/12/22 09:30 10/28/22 14:53  INSULIN 2.6 - 24.9 uIU/mL 19.2 9.9   3. Vitamin D deficiency  Latest Reference Range & Units 06/12/22 09:30 10/28/22 14:53  Vitamin D, 25-Hydroxy 30.0 - 100.0 ng/mL 25.7 (L) 50.4  (L): Data is abnormally low  She is in weekly Ergocalciferol- denies N/V/Muscle Weakness  Assessment/Plan:   1. Healthcare maintenance Check Labs  2. Insulin resistance Check Labs -  Comprehensive metabolic panel - Hemoglobin A1c - Insulin, random - Magnesium - Vitamin B12 Re-start  metFORMIN (GLUCOPHAGE) 1000 MG tablet 1 tab daily with lunch Dispense: 90 tablet, Refills: 0 ordered   Take 1/2 tab daily for 2 weeks, then increase to 1 full tab daily  3. Vitamin D deficiency Check Labs and Refill - VITAMIN D 25 Hydroxy (Vit-D Deficiency, Fractures) - Vitamin D, Ergocalciferol, (DRISDOL) 1.25 MG (50000 UNIT) CAPS capsule; Take 1 capsule (50,000 Units total) by mouth every 7 (seven) days.  Dispense: 5 capsule; Refill: 0  4. Obesity,current BMI 43.34  Tiawana is currently in the action stage of change. As such, her goal is to get back to weightloss efforts . She has agreed to the Category 1 Plan.   Exercise goals: All adults should avoid inactivity. Some physical activity is better than none, and adults who participate in any amount of physical activity gain some health benefits. Adults should also include muscle-strengthening activities that involve all major muscle groups on 2 or more days a week.  Behavioral modification strategies: increasing lean protein intake, decreasing simple carbohydrates, increasing vegetables, increasing water intake, decreasing liquid calories, decreasing eating out, no skipping meals, meal planning and cooking strategies, keeping healthy foods in the home, and planning for success.  Johnia has agreed to follow-up with our clinic in 4 weeks. She was informed of the importance of frequent follow-up visits to maximize her success with intensive lifestyle modifications for her multiple health conditions.   Marcelino Duster  was informed we would discuss her lab results at her next visit unless there is a critical issue that needs to be addressed sooner. Mirisa agreed to keep her next visit at the agreed upon time to discuss these results.  Objective:   Blood pressure (!) 138/91, pulse 82, temperature 98.3 F (36.8 C), height 5\' 2"  (1.575 m),  weight 237 lb (107.5 kg), SpO2 96%. Body mass index is 43.35 kg/m.  General: Cooperative, alert, well developed, in no acute distress. HEENT: Conjunctivae and lids unremarkable. Cardiovascular: Regular rhythm.  Lungs: Normal work of breathing. Neurologic: No focal deficits.   Lab Results  Component Value Date   CREATININE 0.86 10/28/2022   BUN 18 10/28/2022   NA 138 10/28/2022   K 4.3 10/28/2022   CL 103 10/28/2022   CO2 19 (L) 10/28/2022   Lab Results  Component Value Date   ALT 13 10/28/2022   AST 12 10/28/2022   ALKPHOS 60 10/28/2022   BILITOT 0.2 10/28/2022   Lab Results  Component Value Date   HGBA1C 5.7 (H) 10/28/2022   HGBA1C 5.5 06/12/2022   Lab Results  Component Value Date   INSULIN 9.9 10/28/2022   INSULIN 19.2 06/12/2022   Lab Results  Component Value Date   TSH 1.910 06/12/2022   Lab Results  Component Value Date   CHOL 236 (H) 06/12/2022   HDL 67 06/12/2022   LDLCALC 148 (H) 06/12/2022   TRIG 119 06/12/2022   CHOLHDL 3.5 06/12/2022   Lab Results  Component Value Date   VD25OH 50.4 10/28/2022   VD25OH 25.7 (L) 06/12/2022   Lab Results  Component Value Date   WBC 5.8 06/12/2022   HGB 12.8 06/12/2022   HCT 38.5 06/12/2022   MCV 90 06/12/2022   PLT 309 06/12/2022   No results found for: "IRON", "TIBC", "FERRITIN"  Attestation Statements:   Reviewed by clinician on day of visit: allergies, medications, problem list, medical history, surgical history, family history, social history, and previous encounter notes.  I have reviewed the above documentation for accuracy and completeness, and I agree with the above. -  Doha Boling d. Taelyn Nemes, NP-C

## 2023-07-21 LAB — COMPREHENSIVE METABOLIC PANEL
ALT: 16 [IU]/L (ref 0–32)
AST: 15 [IU]/L (ref 0–40)
Albumin: 4.2 g/dL (ref 3.9–4.9)
Alkaline Phosphatase: 64 [IU]/L (ref 44–121)
BUN/Creatinine Ratio: 15 (ref 9–23)
BUN: 14 mg/dL (ref 6–24)
Bilirubin Total: <0.2 mg/dL (ref 0.0–1.2)
CO2: 20 mmol/L (ref 20–29)
Calcium: 9.2 mg/dL (ref 8.7–10.2)
Chloride: 106 mmol/L (ref 96–106)
Creatinine, Ser: 0.91 mg/dL (ref 0.57–1.00)
Globulin, Total: 2.8 g/dL (ref 1.5–4.5)
Glucose: 82 mg/dL (ref 70–99)
Potassium: 4.3 mmol/L (ref 3.5–5.2)
Sodium: 145 mmol/L — ABNORMAL HIGH (ref 134–144)
Total Protein: 7 g/dL (ref 6.0–8.5)
eGFR: 80 mL/min/{1.73_m2} (ref >59)

## 2023-07-21 LAB — VITAMIN B12: Vitamin B-12: 394 pg/mL (ref 232–1245)

## 2023-07-21 LAB — HEMOGLOBIN A1C
Est. average glucose Bld gHb Est-mCnc: 126 mg/dL
Hgb A1c MFr Bld: 6 % — ABNORMAL HIGH (ref 4.8–5.6)

## 2023-07-21 LAB — INSULIN, RANDOM: INSULIN: 16.7 u[IU]/mL (ref 2.6–24.9)

## 2023-07-21 LAB — MAGNESIUM: Magnesium: 1.9 mg/dL (ref 1.6–2.3)

## 2023-07-21 LAB — VITAMIN D 25 HYDROXY (VIT D DEFICIENCY, FRACTURES): Vit D, 25-Hydroxy: 30.8 ng/mL (ref 30.0–100.0)

## 2023-08-17 ENCOUNTER — Ambulatory Visit (INDEPENDENT_AMBULATORY_CARE_PROVIDER_SITE_OTHER): Payer: BC Managed Care – PPO | Admitting: Adult Health

## 2023-09-16 DIAGNOSIS — E282 Polycystic ovarian syndrome: Secondary | ICD-10-CM | POA: Diagnosis not present

## 2023-09-16 DIAGNOSIS — Z0000 Encounter for general adult medical examination without abnormal findings: Secondary | ICD-10-CM | POA: Diagnosis not present

## 2023-09-16 DIAGNOSIS — E782 Mixed hyperlipidemia: Secondary | ICD-10-CM | POA: Diagnosis not present

## 2023-09-16 DIAGNOSIS — H169 Unspecified keratitis: Secondary | ICD-10-CM | POA: Diagnosis not present

## 2023-09-28 ENCOUNTER — Encounter (INDEPENDENT_AMBULATORY_CARE_PROVIDER_SITE_OTHER): Payer: Self-pay | Admitting: Adult Health

## 2023-09-28 ENCOUNTER — Ambulatory Visit (INDEPENDENT_AMBULATORY_CARE_PROVIDER_SITE_OTHER): Payer: BC Managed Care – PPO | Admitting: Adult Health

## 2023-09-28 VITALS — BP 133/88 | HR 85 | Temp 98.2°F | Ht 62.0 in | Wt 230.0 lb

## 2023-09-28 DIAGNOSIS — E559 Vitamin D deficiency, unspecified: Secondary | ICD-10-CM

## 2023-09-28 DIAGNOSIS — E7849 Other hyperlipidemia: Secondary | ICD-10-CM

## 2023-09-28 DIAGNOSIS — E669 Obesity, unspecified: Secondary | ICD-10-CM

## 2023-09-28 DIAGNOSIS — E88819 Insulin resistance, unspecified: Secondary | ICD-10-CM

## 2023-09-28 DIAGNOSIS — E66812 Obesity, class 2: Secondary | ICD-10-CM

## 2023-09-28 DIAGNOSIS — R5383 Other fatigue: Secondary | ICD-10-CM | POA: Diagnosis not present

## 2023-09-28 DIAGNOSIS — Z0000 Encounter for general adult medical examination without abnormal findings: Secondary | ICD-10-CM

## 2023-09-28 DIAGNOSIS — Z6841 Body Mass Index (BMI) 40.0 and over, adult: Secondary | ICD-10-CM

## 2023-09-28 MED ORDER — VITAMIN D (ERGOCALCIFEROL) 1.25 MG (50000 UNIT) PO CAPS: 50000.0000 [IU] | ORAL_CAPSULE | ORAL | 0 refills | Status: DC

## 2023-09-28 MED ORDER — METFORMIN HCL ER 500 MG PO TB24: ORAL_TABLET | ORAL | 0 refills | Status: DC

## 2023-09-28 NOTE — Progress Notes (Signed)
WEIGHT SUMMARY AND BIOMETRICS  Vitals Temp: 98.2 F (36.8 C) BP: 133/88 Pulse Rate: 85 SpO2: 98 %   Anthropometric Measurements Height: 5\' 2"  (1.575 m) Weight: 230 lb (104.3 kg) BMI (Calculated): 42.06 Weight at Last Visit: 237 lb Weight Lost Since Last Visit: 7 lb Weight Gained Since Last Visit: 0 Starting Weight: 216 lb Total Weight Loss (lbs): 0 lb (0 kg)   Body Composition  Body Fat %: 49.5 % Fat Mass (lbs): 114 lbs Muscle Mass (lbs): 110.6 lbs Total Body Water (lbs): 84.6 lbs Visceral Fat Rating : 15   Other Clinical Data Fasting: No Labs: No Today's Visit #: [id number]    Chief Complaint:   OBESITY Breanna Dunn is here to discuss her progress with her obesity treatment plan.  She is on the the Category 1 Plan and states she is following her eating plan approximately 75 % of the time.  She states she is exercising: None.   Interim History:  Last OV at [facility] was on 07/20/2023- lost to follow-up Labs completed on 07/20/2023 She has not missed any doses of daily Metformin or weekly Ergocalciferol She reports GI upset with Metformin 1000mg  plain formulation. She is agreeable changing to XR formulation  She has started new food delivery system: [employer] This program offers a wide variety of foods and she states "its a lot less restrictive"  than Cat 1 Meal Plan.  Reviewed Bioimpedance Results with pt: Muscle Mass: - 4 .8 lbs Adipose Mass: -1.8 lbs  Subjective:   1. Healthcare maintenance BP slightly elevated at OV Discussed Labs 07/20/2023 CMP: Electrolytes and liver/kidney enzymes are normal  2. Insulin resistance Discussed Labs  Latest Reference Range & Units 07/20/23 11:33  Glucose 70 - 99 mg/dL 82  Hemoglobin V4U 4.8 - 5.6 % 6.0 (H)  Est. average glucose Bld gHb Est-mCnc mg/dL 981  INSULIN 2.6 - 19.1 uIU/mL 16.7  (H): Data is abnormally high  She is currently on daily Metformin 1000mg . She reports GU upset. She is agreeable to  changing from plain formulation to XR. She is unsure if her 2025 insurance will cover Wegovy or Zepbound therapy.  3. Vitamin D deficiency Discussed Labs  Latest Reference Range & Units 07/20/23 11:33  Vitamin D, 25-Hydroxy 30.0 - 100.0 ng/mL 30.8       Level stable, however well below goal of 50-70  4. Other hyperlipidemia Discussed Labs Lipid Panel     Component Value Date/Time   CHOL 236 (H) 06/12/2022 0930   TRIG 119 06/12/2022 0930   HDL 67 06/12/2022 0930   CHOLHDL 3.5 06/12/2022 0930   LDLCALC 148 (H) 06/12/2022 0930   LABVLDL 21 06/12/2022 0930    Total and LDL levels above goal She is not on statin therapy.  5. Other fatigue Discussed Labs  Latest Reference Range & Units 07/20/23 11:33  Vitamin D, 25-Hydroxy 30.0 - 100.0 ng/mL 30.8  Vitamin B12 232 - 1,245 pg/mL 394   She is currently on weekly Ergocalferol- last dose was sat 09/26/23  Assessment/Plan:   1. Healthcare maintenance (Primary) Increase regular exercise AIm to walk at least 3 times per week  2. Insulin resistance Stop Metformin 1000mg  Start  metFORMIN (GLUCOPHAGE-XR) 500 MG 24 hr tablet 2 tabs daily with largest meal Dispense: 60 tablet, Refills: 0 ordered   3. Vitamin D deficiency Refill Vitamin D, Ergocalciferol, (DRISDOL) 1.25 MG (50000 UNIT) CAPS capsule Take 1 capsule (50,000 Units total) by mouth every 7 (seven) days. Dispense: 5 capsule, Refills:  0 ordered   4. Other hyperlipidemia Continue to reduce saturated fat Increase regular exercise- aim for walking at least 3 times per week.  5. Other fatigue Continue weekly Ergocalciferol  6. Obesity,current BMI 42.1  Breanna Dunn is currently in the action stage of change. As such, her goal is to continue with weight loss efforts. She has agreed to keeping a food journal and adhering to recommended goals of 1000 calories and 85+ protein.   Exercise goals: All adults should avoid inactivity. Some physical activity is better than none, and  adults who participate in any amount of physical activity gain some health benefits. Adults should also include muscle-strengthening activities that involve all major muscle groups on 2 or more days a week.  Behavioral modification strategies: increasing lean protein intake, decreasing simple carbohydrates, increasing vegetables, increasing water intake, no skipping meals, meal planning and cooking strategies, keeping healthy foods in the home, ways to avoid boredom eating, and planning for success.  Breanna Dunn has agreed to follow-up with our clinic in 4 weeks. She was informed of the importance of frequent follow-up visits to maximize her success with intensive lifestyle modifications for her multiple health conditions.   Objective:   Blood pressure 133/88, pulse 85, temperature 98.2 F (36.8 C), height 5\' 2"  (1.575 m), weight 230 lb (104.3 kg), SpO2 98%. Body mass index is 42.07 kg/m.  General: Cooperative, alert, well developed, in no acute distress. HEENT: Conjunctivae and lids unremarkable. Cardiovascular: Regular rhythm.  Lungs: Normal work of breathing. Neurologic: No focal deficits.   Lab Results  Component Value Date   CREATININE 0.91 07/20/2023   BUN 14 07/20/2023   NA 145 (H) 07/20/2023   K 4.3 07/20/2023   CL 106 07/20/2023   CO2 20 07/20/2023   Lab Results  Component Value Date   ALT 16 07/20/2023   AST 15 07/20/2023   ALKPHOS 64 07/20/2023   BILITOT <0.2 07/20/2023   Lab Results  Component Value Date   HGBA1C 6.0 (H) 07/20/2023   HGBA1C 5.7 (H) 10/28/2022   HGBA1C 5.5 06/12/2022   Lab Results  Component Value Date   INSULIN 16.7 07/20/2023   INSULIN 9.9 10/28/2022   INSULIN 19.2 06/12/2022   Lab Results  Component Value Date   TSH 1.910 06/12/2022   Lab Results  Component Value Date   CHOL 236 (H) 06/12/2022   HDL 67 06/12/2022   LDLCALC 148 (H) 06/12/2022   TRIG 119 06/12/2022   CHOLHDL 3.5 06/12/2022   Lab Results  Component Value Date    VD25OH 30.8 07/20/2023   VD25OH 50.4 10/28/2022   VD25OH 25.7 (L) 06/12/2022   Lab Results  Component Value Date   WBC 5.8 06/12/2022   HGB 12.8 06/12/2022   HCT 38.5 06/12/2022   MCV 90 06/12/2022   PLT 309 06/12/2022   No results found for: "IRON", "TIBC", "FERRITIN"  Attestation Statements:   Reviewed by clinician on day of visit: allergies, medications, problem list, medical history, surgical history, family history, social history, and previous encounter notes.  I have reviewed the above documentation for accuracy and completeness, and I agree with the above. -  Tim Corriher d. Yahye Siebert, NP-C

## 2023-10-07 DIAGNOSIS — F4320 Adjustment disorder, unspecified: Secondary | ICD-10-CM | POA: Diagnosis not present

## 2023-10-07 DIAGNOSIS — Z62819 Personal history of unspecified abuse in childhood: Secondary | ICD-10-CM | POA: Diagnosis not present

## 2023-10-15 DIAGNOSIS — Z62819 Personal history of unspecified abuse in childhood: Secondary | ICD-10-CM | POA: Diagnosis not present

## 2023-10-15 DIAGNOSIS — F4320 Adjustment disorder, unspecified: Secondary | ICD-10-CM | POA: Diagnosis not present

## 2023-10-24 ENCOUNTER — Other Ambulatory Visit (INDEPENDENT_AMBULATORY_CARE_PROVIDER_SITE_OTHER): Payer: Self-pay | Admitting: Adult Health

## 2023-11-02 ENCOUNTER — Encounter (INDEPENDENT_AMBULATORY_CARE_PROVIDER_SITE_OTHER): Payer: Self-pay | Admitting: Adult Health

## 2023-11-02 ENCOUNTER — Ambulatory Visit (INDEPENDENT_AMBULATORY_CARE_PROVIDER_SITE_OTHER): Payer: BC Managed Care – PPO | Admitting: Adult Health

## 2023-11-02 VITALS — BP 139/76 | HR 99 | Temp 97.9°F | Ht 62.0 in | Wt 227.0 lb

## 2023-11-02 DIAGNOSIS — E88819 Insulin resistance, unspecified: Secondary | ICD-10-CM | POA: Diagnosis not present

## 2023-11-02 DIAGNOSIS — E559 Vitamin D deficiency, unspecified: Secondary | ICD-10-CM | POA: Diagnosis not present

## 2023-11-02 DIAGNOSIS — E669 Obesity, unspecified: Secondary | ICD-10-CM

## 2023-11-02 DIAGNOSIS — Z6841 Body Mass Index (BMI) 40.0 and over, adult: Secondary | ICD-10-CM | POA: Diagnosis not present

## 2023-11-02 DIAGNOSIS — E66812 Obesity, class 2: Secondary | ICD-10-CM

## 2023-11-02 MED ORDER — VITAMIN D (ERGOCALCIFEROL) 1.25 MG (50000 UNIT) PO CAPS: 50000.0000 [IU] | ORAL_CAPSULE | ORAL | 0 refills | Status: DC

## 2023-11-02 MED ORDER — METFORMIN HCL ER 500 MG PO TB24: ORAL_TABLET | ORAL | 0 refills | Status: DC

## 2023-11-02 NOTE — Progress Notes (Unsigned)
 WEIGHT SUMMARY AND BIOMETRICS  Vitals Temp: 97.9 F (36.6 C) BP: 139/76 Pulse Rate: 99 SpO2: 100 %   Anthropometric Measurements Height: 5\' 2"  (1.575 m) Weight: 227 lb (103 kg) BMI (Calculated): 41.51 Weight at Last Visit: 230 lb Weight Lost Since Last Visit: 3 Weight Gained Since Last Visit: 0 Starting Weight: 216 lb Total Weight Loss (lbs): 3 lb (1.361 kg)   Body Composition  Body Fat %: 49.6 % Fat Mass (lbs): 112.6 lbs Muscle Mass (lbs): 108.6 lbs Total Body Water (lbs): 86 lbs Visceral Fat Rating : 14   Other Clinical Data Fasting: no Labs: no Today's Visit #: 06/12/22    Chief Complaint:   OBESITY Breanna Dunn is here to discuss her progress with her obesity treatment plan.  She is on the the Category 1 Plan and states she is following her eating plan approximately 70 % of the time.  She states she is exercising Walking 60 minutes 2 times per week.   Interim History:  She and her best friend travelled to [place] to celebrate their birthdays! They enjoyed time together and baking/cooking classes.  Hunger/appetite-09/28/2023 plain Metformin 1000mg  replaced with Metformin XR 500mg  2 tabs= NO GI UPSET and stable appetite  Stress- none at present- work is calm and her children are doing well!  Exercise-brisk walking 60 mins at least 2 x week  Hydration-she estimates to drink 40 oz water/day  Subjective:   1. Insulin resistance  Latest Reference Range & Units 10/28/22 14:53 07/20/23 11:33  INSULIN 2.6 - 24.9 uIU/mL 9.9 16.7   Insulin level improving She is currently on Metformin XR 500mg - 2 tabs daily She denies GI upset and endorses stable appetite She endorses less bloating and diffuse inflammation as well  2. Vitamin Dunn deficiency  Latest Reference Range & Units 07/20/23 11:33  Vitamin Dunn, 25-Hydroxy 30.0 - 100.0 ng/mL 30.8  Vitamin B12 232 - 1,245 pg/mL 394   She is on weekly Ergocalciferol- denies N/V/Muscle Weakness  Assessment/Plan:    1. Insulin resistance (Primary) Refill metFORMIN (GLUCOPHAGE-XR) 500 MG 24 hr tablet 2 tabs daily with largest meal Dispense: 60 tablet, Refills: 0 ordered   2. Vitamin Dunn deficiency Refill - Vitamin Dunn, Ergocalciferol, (DRISDOL) 1.25 MG (50000 UNIT) CAPS capsule; Take 1 capsule (50,000 Units total) by mouth every 7 (seven) days.  Dispense: 5 capsule; Refill: 0  3. Obesity,current BMI 41.5  Jaxie is not currently in the action stage of change. As such, her goal is to continue with weight loss efforts. She has agreed to the Category 1 Plan.   Exercise goals: For substantial health benefits, adults should do at least 150 minutes (2 hours and 30 minutes) a week of moderate-intensity, or 75 minutes (1 hour and 15 minutes) a week of vigorous-intensity aerobic physical activity, or an equivalent combination of moderate- and vigorous-intensity aerobic activity. Aerobic activity should be performed in episodes of at least 10 minutes, and preferably, it should be spread throughout the week.  Behavioral modification strategies: increasing lean protein intake, decreasing simple carbohydrates, increasing vegetables, increasing water intake, no skipping meals, meal planning and cooking strategies, keeping healthy foods in the home, ways to avoid boredom eating, and planning for success.  Lakecia has agreed to follow-up with our clinic in 4 weeks. She was informed of the importance of frequent follow-up visits to maximize her success with intensive lifestyle modifications for her multiple health conditions.   Objective:   Blood pressure 139/76, pulse 99, temperature 97.9 F (36.6 C), height  5\' 2"  (1.575 m), weight 227 lb (103 kg), last menstrual period 04/23/2022, SpO2 100%. Body mass index is 41.52 kg/m.  General: Cooperative, alert, well developed, in no acute distress. HEENT: Conjunctivae and lids unremarkable. Cardiovascular: Regular rhythm.  Lungs: Normal work of breathing. Neurologic: No  focal deficits.   Lab Results  Component Value Date   CREATININE 0.91 07/20/2023   BUN 14 07/20/2023   NA 145 (H) 07/20/2023   K 4.3 07/20/2023   CL 106 07/20/2023   CO2 20 07/20/2023   Lab Results  Component Value Date   ALT 16 07/20/2023   AST 15 07/20/2023   ALKPHOS 64 07/20/2023   BILITOT <0.2 07/20/2023   Lab Results  Component Value Date   HGBA1C 6.0 (H) 07/20/2023   HGBA1C 5.7 (H) 10/28/2022   HGBA1C 5.5 06/12/2022   Lab Results  Component Value Date   INSULIN 16.7 07/20/2023   INSULIN 9.9 10/28/2022   INSULIN 19.2 06/12/2022   Lab Results  Component Value Date   TSH 1.910 06/12/2022   Lab Results  Component Value Date   CHOL 236 (H) 06/12/2022   HDL 67 06/12/2022   LDLCALC 148 (H) 06/12/2022   TRIG 119 06/12/2022   CHOLHDL 3.5 06/12/2022   Lab Results  Component Value Date   VD25OH 30.8 07/20/2023   VD25OH 50.4 10/28/2022   VD25OH 25.7 (L) 06/12/2022   Lab Results  Component Value Date   WBC 5.8 06/12/2022   HGB 12.8 06/12/2022   HCT 38.5 06/12/2022   MCV 90 06/12/2022   PLT 309 06/12/2022   No results found for: "IRON", "TIBC", "FERRITIN"  Attestation Statements:   Reviewed by clinician on day of visit: allergies, medications, problem list, medical history, surgical history, family history, social history, and previous encounter notes.  I have reviewed the above documentation for accuracy and completeness, and I agree with the above. -  Breanna Dunn. Breanna Schroeter, NP-C

## 2023-11-05 DIAGNOSIS — Z62819 Personal history of unspecified abuse in childhood: Secondary | ICD-10-CM | POA: Diagnosis not present

## 2023-11-05 DIAGNOSIS — F4320 Adjustment disorder, unspecified: Secondary | ICD-10-CM | POA: Diagnosis not present

## 2023-11-11 DIAGNOSIS — F4320 Adjustment disorder, unspecified: Secondary | ICD-10-CM | POA: Diagnosis not present

## 2023-11-11 DIAGNOSIS — Z62819 Personal history of unspecified abuse in childhood: Secondary | ICD-10-CM | POA: Diagnosis not present

## 2023-12-07 ENCOUNTER — Other Ambulatory Visit (INDEPENDENT_AMBULATORY_CARE_PROVIDER_SITE_OTHER): Payer: Self-pay | Admitting: Adult Health

## 2023-12-08 ENCOUNTER — Encounter (INDEPENDENT_AMBULATORY_CARE_PROVIDER_SITE_OTHER): Payer: Self-pay | Admitting: Adult Health

## 2023-12-08 ENCOUNTER — Ambulatory Visit (INDEPENDENT_AMBULATORY_CARE_PROVIDER_SITE_OTHER): Admitting: Adult Health

## 2023-12-08 VITALS — BP 127/84 | HR 83 | Temp 98.1°F | Ht 62.0 in | Wt 219.0 lb

## 2023-12-08 DIAGNOSIS — E88819 Insulin resistance, unspecified: Secondary | ICD-10-CM | POA: Diagnosis not present

## 2023-12-08 DIAGNOSIS — F419 Anxiety disorder, unspecified: Secondary | ICD-10-CM

## 2023-12-08 DIAGNOSIS — E559 Vitamin D deficiency, unspecified: Secondary | ICD-10-CM

## 2023-12-08 DIAGNOSIS — E669 Obesity, unspecified: Secondary | ICD-10-CM

## 2023-12-08 DIAGNOSIS — F32A Depression, unspecified: Secondary | ICD-10-CM

## 2023-12-08 DIAGNOSIS — Z6841 Body Mass Index (BMI) 40.0 and over, adult: Secondary | ICD-10-CM

## 2023-12-08 MED ORDER — METFORMIN HCL ER 500 MG PO TB24: ORAL_TABLET | ORAL | 0 refills | Status: AC

## 2023-12-08 MED ORDER — VITAMIN D (ERGOCALCIFEROL) 1.25 MG (50000 UNIT) PO CAPS: 50000.0000 [IU] | ORAL_CAPSULE | ORAL | 0 refills | Status: AC

## 2023-12-08 NOTE — Progress Notes (Signed)
 WEIGHT SUMMARY AND BIOMETRICS  Vitals Temp: 98.1 F (36.7 C) BP: 127/84 Pulse Rate: 83 SpO2: 97 %   Anthropometric Measurements Height: 5\' 2"  (1.575 m) Weight: 219 lb (99.3 kg) BMI (Calculated): 40.05 Weight at Last Visit: 227lb Weight Lost Since Last Visit: 8lb Weight Gained Since Last Visit: 0 Starting Weight: 216lb Total Weight Loss (lbs): 0 lb (0 kg)   Body Composition  Body Fat %: 47.6 % Fat Mass (lbs): 104.4 lbs Muscle Mass (lbs): 109 lbs Total Body Water (lbs): 85.4 lbs Visceral Fat Rating : 13   Other Clinical Data Fasting: no Labs: no Today's Visit #: 06/12/2022    Chief Complaint:   OBESITY Breanna Dunn is here to discuss her progress with her obesity treatment plan.  She is on the the Category 1 Plan and states she is following her eating plan approximately 80 % of the time.  She states she is exercising Walking 30-40 minutes 4-5 times per week.  Interim History:  Reviewed Bioim[employer] results with pt: Muscle Mass: + 0.4 lb Adipose Mass: -8.2 lbs  Breanna Dunn's health goals for Spring/Summer 2025: Consistent Progress!  Hunger/appetite-well controlled hunger levels.  Exercise-walking either outside or at home on her walking pad  She and her immediate family will travel to [place] next week for 7 day vacation!  Subjective:   1. Vitamin D deficiency  Latest Reference Range & Units 06/12/22 09:30 10/28/22 14:53 07/20/23 11:33  Vitamin D, 25-Hydroxy 30.0 - 100.0 ng/mL 25.7 (L) 50.4 30.8  (L): Data is abnormally low  She endorses stable energy levels She is on weekly Ergocalciferol- denies N/V/Muscle Weakness  2. Insulin resistance  Latest Reference Range & Units 06/12/22 09:30 10/28/22 14:53 07/20/23 11:33  INSULIN 2.6 - 24.9 uIU/mL 19.2 9.9 16.7   Hunger/appetite-09/28/2023 plain Metformin 1000mg  replaced with Metformin XR 500mg  2 tabs= NO GI UPSET and stable appetite  She ist taking Metformin with largest meal of the day.  3.  Anxiety and depression PCP increased Fluoxetine from 10mg  to 20mg  in Jan 2025- increased due to uncontrolled anxiety levels She now endorses stable mood, denies SI/HI  Assessment/Plan:   1. Vitamin D deficiency Refill - Vitamin D, Ergocalciferol, (DRISDOL) 1.25 MG (50000 UNIT) CAPS capsule; Take 1 capsule (50,000 Units total) by mouth every 7 (seven) days.  Dispense: 5 capsule; Refill: 0  2. Insulin resistance (Primary) Refill  metFORMIN (GLUCOPHAGE-XR) 500 MG 24 hr tablet 2 tabs daily with largest meal Dispense: 60 tablet, Refills: 0 ordered   3. Anxiety and depression Continue regular exercise Continue daily SSRI therapy  4. Obesity,current BMI 40.05  Breanna Dunn is currently in the action stage of change. As such, her goal is to continue with weight loss efforts. She has agreed to the Category 1 Plan.   Exercise goals: For substantial health benefits, adults should do at least 150 minutes (2 hours and 30 minutes) a week of moderate-intensity, or 75 minutes (1 hour and 15 minutes) a week of vigorous-intensity aerobic physical activity, or an equivalent combination of moderate- and vigorous-intensity aerobic activity. Aerobic activity should be performed in episodes of at least 10 minutes, and preferably, it should be spread throughout the week.  Behavioral modification strategies: increasing lean protein intake, decreasing simple carbohydrates, increasing vegetables, increasing water intake, no skipping meals, meal planning and cooking strategies, keeping healthy foods in the home, ways to avoid boredom eating, and planning for success.  Breanna Dunn has agreed to follow-up with our clinic in 4 weeks. She was informed of  the importance of frequent follow-up visits to maximize her success with intensive lifestyle modifications for her multiple health conditions.   Check Fasting Labs at next OV  Objective:   Blood pressure 127/84, pulse 83, temperature 98.1 F (36.7 C), height 5\' 2"  (1.575  m), weight 219 lb (99.3 kg), SpO2 97%. Body mass index is 40.06 kg/m.  General: Cooperative, alert, well developed, in no acute distress. HEENT: Conjunctivae and lids unremarkable. Cardiovascular: Regular rhythm.  Lungs: Normal work of breathing. Neurologic: No focal deficits.   Lab Results  Component Value Date   CREATININE 0.91 07/20/2023   BUN 14 07/20/2023   NA 145 (H) 07/20/2023   K 4.3 07/20/2023   CL 106 07/20/2023   CO2 20 07/20/2023   Lab Results  Component Value Date   ALT 16 07/20/2023   AST 15 07/20/2023   ALKPHOS 64 07/20/2023   BILITOT <0.2 07/20/2023   Lab Results  Component Value Date   HGBA1C 6.0 (H) 07/20/2023   HGBA1C 5.7 (H) 10/28/2022   HGBA1C 5.5 06/12/2022   Lab Results  Component Value Date   INSULIN 16.7 07/20/2023   INSULIN 9.9 10/28/2022   INSULIN 19.2 06/12/2022   Lab Results  Component Value Date   TSH 1.910 06/12/2022   Lab Results  Component Value Date   CHOL 236 (H) 06/12/2022   HDL 67 06/12/2022   LDLCALC 148 (H) 06/12/2022   TRIG 119 06/12/2022   CHOLHDL 3.5 06/12/2022   Lab Results  Component Value Date   VD25OH 30.8 07/20/2023   VD25OH 50.4 10/28/2022   VD25OH 25.7 (L) 06/12/2022   Lab Results  Component Value Date   WBC 5.8 06/12/2022   HGB 12.8 06/12/2022   HCT 38.5 06/12/2022   MCV 90 06/12/2022   PLT 309 06/12/2022   No results found for: "IRON", "TIBC", "FERRITIN"  Attestation Statements:   Reviewed by clinician on day of visit: allergies, medications, problem list, medical history, surgical history, family history, social history, and previous encounter notes.  I have reviewed the above documentation for accuracy and completeness, and I agree with the above. -  Ruxin Ransome d. Angelica Frandsen, NP-C

## 2023-12-24 DIAGNOSIS — F4320 Adjustment disorder, unspecified: Secondary | ICD-10-CM | POA: Diagnosis not present

## 2023-12-24 DIAGNOSIS — Z62819 Personal history of unspecified abuse in childhood: Secondary | ICD-10-CM | POA: Diagnosis not present

## 2024-01-14 ENCOUNTER — Ambulatory Visit (INDEPENDENT_AMBULATORY_CARE_PROVIDER_SITE_OTHER): Admitting: Adult Health

## 2024-02-24 ENCOUNTER — Ambulatory Visit (INDEPENDENT_AMBULATORY_CARE_PROVIDER_SITE_OTHER): Admitting: Adult Health

## 2024-03-10 ENCOUNTER — Encounter (INDEPENDENT_AMBULATORY_CARE_PROVIDER_SITE_OTHER): Payer: Self-pay

## 2024-03-15 DIAGNOSIS — H18453 Nodular corneal degeneration, bilateral: Secondary | ICD-10-CM | POA: Diagnosis not present

## 2024-04-01 DIAGNOSIS — H18453 Nodular corneal degeneration, bilateral: Secondary | ICD-10-CM | POA: Diagnosis not present

## 2024-07-13 DIAGNOSIS — Z800 Family history of malignant neoplasm of digestive organs: Secondary | ICD-10-CM | POA: Diagnosis not present

## 2024-07-13 DIAGNOSIS — R197 Diarrhea, unspecified: Secondary | ICD-10-CM | POA: Diagnosis not present

## 2024-07-13 DIAGNOSIS — Z1211 Encounter for screening for malignant neoplasm of colon: Secondary | ICD-10-CM | POA: Diagnosis not present

## 2024-07-25 DIAGNOSIS — Z1231 Encounter for screening mammogram for malignant neoplasm of breast: Secondary | ICD-10-CM | POA: Diagnosis not present

## 2024-07-26 DIAGNOSIS — Z1211 Encounter for screening for malignant neoplasm of colon: Secondary | ICD-10-CM | POA: Diagnosis not present

## 2024-07-26 DIAGNOSIS — D124 Benign neoplasm of descending colon: Secondary | ICD-10-CM | POA: Diagnosis not present

## 2024-07-26 DIAGNOSIS — G43909 Migraine, unspecified, not intractable, without status migrainosus: Secondary | ICD-10-CM | POA: Diagnosis not present

## 2024-07-26 DIAGNOSIS — K635 Polyp of colon: Secondary | ICD-10-CM | POA: Diagnosis not present

## 2024-07-26 DIAGNOSIS — D123 Benign neoplasm of transverse colon: Secondary | ICD-10-CM | POA: Diagnosis not present

## 2024-07-26 DIAGNOSIS — K5730 Diverticulosis of large intestine without perforation or abscess without bleeding: Secondary | ICD-10-CM | POA: Diagnosis not present

## 2024-09-09 ENCOUNTER — Other Ambulatory Visit: Payer: Self-pay | Admitting: Obstetrics & Gynecology

## 2024-09-09 DIAGNOSIS — Z9189 Other specified personal risk factors, not elsewhere classified: Secondary | ICD-10-CM

## 2024-10-16 ENCOUNTER — Other Ambulatory Visit: Payer: Self-pay
